# Patient Record
Sex: Female | Born: 1985 | Race: White | Marital: Married | Smoking: Never smoker
Health system: Southern US, Community
[De-identification: ages and names within clinical notes are randomized; demographics above are authoritative.]

---

## 2020-07-25 ENCOUNTER — Other Ambulatory Visit: Payer: Self-pay | Admitting: Obstetrics and Gynecology

## 2020-07-25 DIAGNOSIS — Z3149 Encounter for other procreative investigation and testing: Secondary | ICD-10-CM

## 2020-09-20 NOTE — L&D Delivery Note (Addendum)
Delivery Summary for Hermina Staggers  Labor Events:   Preterm labor: No data found  Rupture date: 08/01/2021  Rupture time: 8:25 PM  Rupture type: Artificial  Fluid Color: Clear Light Meconium  Induction: No data found  Augmentation: No data found  Complications: No data found  Cervical ripening: No data found No data found   No data found     Delivery:   Episiotomy: No data found  Lacerations: No data found  Repair suture: No data found  Repair # of packets: No data found  Blood loss (ml): 850   Information for the patient's newborn:  Deziyah, Arvin [[id number]]   Delivery 08/02/2021 5:15 AM by  C-Section, Low Transverse Sex:  female Gestational Age: [redacted]w[redacted]d Delivery Clinician:   Living?:         APGARS  One minute Five minutes Ten minutes  Skin color:        Heart rate:        Grimace:        Muscle tone:        Breathing:        Totals: 8  8      Presentation/position:      Resuscitation:   Cord information:    Disposition of cord blood:     Blood gases sent?  Complications:   Placenta: Delivered:       appearance Newborn Measurements: Weight: 7 lb 8.6 oz (3420 g)  Height: 19.76"  Head circumference:    Chest circumference:    Other providers:    Additional  information: Forceps:   Vacuum:   Breech:   Observed anomalies       See Dr. Oretha Milch Operative note for details of C-section procedure.    Hildred Laser, MD [facility]

## 2020-09-25 ENCOUNTER — Ambulatory Visit
Admission: RE | Admit: 2020-09-25 | Discharge: 2020-09-25 | Disposition: A | Discharge status: Home / Self Care | Payer: BC Managed Care – PPO | Source: Ambulatory Visit | Attending: Obstetrics and Gynecology | Admitting: Obstetrics and Gynecology

## 2020-09-25 ENCOUNTER — Other Ambulatory Visit: Payer: Self-pay

## 2020-09-25 DIAGNOSIS — Z3149 Encounter for other procreative investigation and testing: Secondary | ICD-10-CM | POA: Insufficient documentation

## 2020-09-25 DIAGNOSIS — N979 Female infertility, unspecified: Secondary | ICD-10-CM | POA: Insufficient documentation

## 2020-09-25 HISTORY — PX: HYSTEROSALPINGOGRAM: SHX6581

## 2020-09-25 MED ORDER — IOHEXOL 300 MG/ML  SOLN: 10.0000 mL | Freq: Once | INTRAMUSCULAR | Status: DC

## 2020-09-25 NOTE — Procedures (Signed)
[id number]  1986/01/29 35 y.o. @TODAY @  , MD  Hysterosalpingogram Procedure Note  Date of procedure: 09/25/2020   Pre-operative Diagnosis: Infertility  Post-operative Diagnosis: same, bilaterally patent tubes  Procedure: Hysterosalpingogram  Surgeon: 11/23/2020, MD  Assistant(s):  Radiology assistant. The radiologist present for today read the imaging and agreed with findings below.  Anesthesia: None  Estimated Blood Loss:  None         Complications:  None; patient tolerated the procedure well.         Disposition: To home         Condition: stable  Findings: Bilateral fill and spill of the tubes and a normal endometrial contour was noted.  Procedure Details  HSG procedure discussed with the patient.  Risks, complications, alternatives have been reviewed with her and she agrees to proceed.   The patient presented to the radiology lab and was identified as the correct patient and the procedure verified as an HSG. A verbal Time Out was held with all team members present and in agreement.  Speculum was inserted in to the vagina and the cervix visualized.  The cervix was cleaned with betadine solution. The HSG catheter was inserted and the balloon insufflated with approximately 1.5 ml of air.  Patient was then repositioned for fluoroscopy.  A total of 15 ml of contrast was used for the procedure. The patient tolerated the procedure well, no complications.   Bilateral fill and spill of the tubes and a normal endometrial contour was noted.  Results were reviewed with the patient at the time of the procedure. She verbalized understanding.   Cline Cools, MD 09/25/2020

## 2021-01-19 DIAGNOSIS — Z3493 Encounter for supervision of normal pregnancy, unspecified, third trimester: Secondary | ICD-10-CM | POA: Insufficient documentation

## 2021-01-22 LAB — OB RESULTS CONSOLE RUBELLA ANTIBODY, IGM: Rubella: IMMUNE

## 2021-01-22 LAB — OB RESULTS CONSOLE HEPATITIS B SURFACE ANTIGEN: Hepatitis B Surface Ag: NEGATIVE

## 2021-01-22 LAB — OB RESULTS CONSOLE VARICELLA ZOSTER ANTIBODY, IGG: Varicella: IMMUNE

## 2021-06-02 ENCOUNTER — Encounter
Admission: RE | Admit: 2021-06-02 | Discharge: 2021-06-02 | Disposition: A | Discharge status: Home / Self Care | Payer: BC Managed Care – PPO | Source: Ambulatory Visit | Attending: Anesthesiology | Admitting: Anesthesiology

## 2021-06-02 ENCOUNTER — Other Ambulatory Visit: Payer: Self-pay

## 2021-06-02 NOTE — Consult Note (Signed)
[facility] Anesthesia Consultation  SIDDHI DORNBUSH OLI:[id number] DOB: 24-Jul-1986 DOA: 06/02/2021 [facility]   Requesting physician: Heloise Ochoa, CNM Date of consultation: 06/02/21 Reason for consultation: Hx of lumbar disc degeneration  CHIEF COMPLAINT:  Pregnancy  HISTORY OF PRESENT ILLNESS: Denise Mcgee  is a 35 y.o. female with a known history of lumbar disc degeneration. She has had back problems for several years and has been through PT several times, including during this pregnancy. Denies personal or family hx of bleeding disorders.   PAST MEDICAL HISTORY:  No past medical history on file.  PAST SURGICAL HISTORY: Wisdom teeth extraction  SOCIAL HISTORY:  Social History   Tobacco Use   Smoking status: Not on file   Smokeless tobacco: Not on file  Substance Use Topics   Alcohol use: Not on file    FAMILY HISTORY: No family history on file.  DRUG ALLERGIES: Not on File  REVIEW OF SYSTEMS:   RESPIRATORY: No cough, shortness of breath, wheezing.  CARDIOVASCULAR: No chest pain, orthopnea, edema.  HEMATOLOGY: No anemia, easy bruising or bleeding SKIN: No rash or lesion. NEUROLOGIC: No tingling, numbness, weakness.  PSYCHIATRY: No anxiety or depression.   MEDICATIONS AT HOME:  Prior to Admission medications   Not on File      PHYSICAL EXAMINATION:   VITAL SIGNS: There were no vitals taken for this visit.  GENERAL:  35 y.o.-year-old patient no acute distress.  HEENT: Head atraumatic, normocephalic.  LUNGS: Normal breath sounds bilaterally, no wheezing, rales,rhonchi. No use of accessory muscles of respiration.  CARDIOVASCULAR: S1, S2 normal. No murmurs, rubs, or gallops.  EXTREMITIES: No pedal edema, cyanosis, or clubbing.  NEUROLOGIC: normal gait PSYCHIATRIC: The patient is alert and oriented x 3.  SKIN: No obvious rash, lesion, or ulcer.    IMPRESSION AND PLAN:   Aurora Rody  is a 35 y.o.  female presenting at [redacted] weeks gestation with hx of chronic back pain. No hx of back surgery.   Discussed epidural as an option for labor analgesia. Pt concerned given her hx of chronic back pain. Discussed that epidural is safe even in this setting and that there has not been shown to be a link between epidurals and worsening of chronic back pain. Spinal interspaces palpable and would not expect difficult placement.

## 2021-06-30 LAB — OB RESULTS CONSOLE RPR: RPR: NONREACTIVE

## 2021-06-30 LAB — OB RESULTS CONSOLE GC/CHLAMYDIA
Chlamydia: NEGATIVE
Gonorrhea: NEGATIVE

## 2021-06-30 LAB — OB RESULTS CONSOLE HIV ANTIBODY (ROUTINE TESTING): HIV: NONREACTIVE

## 2021-06-30 LAB — OB RESULTS CONSOLE GBS: GBS: NEGATIVE

## 2021-07-27 ENCOUNTER — Encounter: Payer: Self-pay | Admitting: Obstetrics

## 2021-07-27 ENCOUNTER — Other Ambulatory Visit: Payer: Self-pay | Admitting: Obstetrics

## 2021-07-27 DIAGNOSIS — O480 Post-term pregnancy: Secondary | ICD-10-CM

## 2021-07-27 NOTE — Progress Notes (Signed)
G1P0000. at [redacted]w[redacted]d, LMP of 10/17/20, c/w early [place] at [redacted]w[redacted]d.  Scheduled for induction of labor for postdates on 08/01/21 @ 0500.   Prenatal provider: [facility] Pregnancy complicated by: Elevated 1 hr GTT Weak rh pos Bilateral low back pain without sciatica  Prenatal Labs: Blood type/Rh O weak D  Antibody screen neg  Rubella Immune  Varicella Immune  RPR NR  HBsAg Neg  HIV NR  GC neg  Chlamydia neg  Genetic screening cfDNA negative  1 hour GTT 141  3 hour GTT 86, 119, 120,98  GBS Neg   Tdap: 05/12/21 Flu: 05/29/22 Contraception: TBD Feeding preference: TBD  ____ Chari Manning, CNM Certified Nurse Midwife [facility]

## 2021-07-30 ENCOUNTER — Other Ambulatory Visit
Admission: RE | Admit: 2021-07-30 | Discharge: 2021-07-30 | Disposition: A | Discharge status: Home / Self Care | Payer: BC Managed Care – PPO | Source: Ambulatory Visit | Attending: Obstetrics and Gynecology | Admitting: Obstetrics and Gynecology

## 2021-07-30 ENCOUNTER — Other Ambulatory Visit: Payer: Self-pay

## 2021-07-30 DIAGNOSIS — Z20822 Contact with and (suspected) exposure to covid-19: Secondary | ICD-10-CM | POA: Insufficient documentation

## 2021-07-30 LAB — SARS CORONAVIRUS 2 (TAT 6-24 HRS): SARS Coronavirus 2: NEGATIVE

## 2021-08-01 ENCOUNTER — Inpatient Hospital Stay: Payer: BC Managed Care – PPO | Admitting: Anesthesiology

## 2021-08-01 ENCOUNTER — Other Ambulatory Visit: Payer: Self-pay

## 2021-08-01 ENCOUNTER — Inpatient Hospital Stay
Admission: EM | Admit: 2021-08-01 | Discharge: 2021-08-05 | DRG: 787 | Disposition: A | Discharge status: Home / Self Care | Payer: BC Managed Care – PPO | Attending: Obstetrics and Gynecology | Admitting: Obstetrics and Gynecology

## 2021-08-01 ENCOUNTER — Encounter: Payer: Self-pay | Admitting: Emergency Medicine

## 2021-08-01 DIAGNOSIS — O480 Post-term pregnancy: Secondary | ICD-10-CM | POA: Diagnosis present

## 2021-08-01 DIAGNOSIS — D62 Acute posthemorrhagic anemia: Secondary | ICD-10-CM | POA: Diagnosis not present

## 2021-08-01 DIAGNOSIS — O9081 Anemia of the puerperium: Secondary | ICD-10-CM | POA: Diagnosis not present

## 2021-08-01 DIAGNOSIS — Z20822 Contact with and (suspected) exposure to covid-19: Secondary | ICD-10-CM | POA: Diagnosis present

## 2021-08-01 DIAGNOSIS — Z3A41 41 weeks gestation of pregnancy: Secondary | ICD-10-CM | POA: Diagnosis not present

## 2021-08-01 DIAGNOSIS — Z3490 Encounter for supervision of normal pregnancy, unspecified, unspecified trimester: Secondary | ICD-10-CM | POA: Diagnosis present

## 2021-08-01 LAB — GLUCOSE, CAPILLARY: Glucose-Capillary: 93 mg/dL (ref 70–99)

## 2021-08-01 LAB — ABO/RH
ABO/RH(D): O NEG
DAT, IgG: NEGATIVE
Weak D: POSITIVE

## 2021-08-01 LAB — CBC
HCT: 36.4 % (ref 36.0–46.0)
Hemoglobin: 12.8 g/dL (ref 12.0–15.0)
MCH: 30.9 pg (ref 26.0–34.0)
MCHC: 35.2 g/dL (ref 30.0–36.0)
MCV: 87.9 fL (ref 80.0–100.0)
Platelets: 177 10*3/uL (ref 150–400)
RBC: 4.14 MIL/uL (ref 3.87–5.11)
RDW: 12.5 % (ref 11.5–15.5)
WBC: 8.9 10*3/uL (ref 4.0–10.5)
nRBC: 0 % (ref 0.0–0.2)

## 2021-08-01 MED ORDER — FENTANYL-BUPIVACAINE-NACL 0.5-0.125-0.9 MG/250ML-% EP SOLN
EPIDURAL | Status: AC
  Filled 2021-08-01: qty 250

## 2021-08-01 MED ORDER — PHENYLEPHRINE 40 MCG/ML (10ML) SYRINGE FOR IV PUSH (FOR BLOOD PRESSURE SUPPORT): 80.0000 ug | PREFILLED_SYRINGE | INTRAVENOUS | Status: DC | PRN

## 2021-08-01 MED ORDER — LIDOCAINE-EPINEPHRINE (PF) 1.5 %-1:200000 IJ SOLN
INTRAMUSCULAR | Status: DC | PRN
  Administered 2021-08-01: 3 mL via PERINEURAL

## 2021-08-01 MED ORDER — LIDOCAINE HCL (PF) 1 % IJ SOLN
INTRAMUSCULAR | Status: AC
  Filled 2021-08-01: qty 30

## 2021-08-01 MED ORDER — LACTATED RINGERS IV SOLN: 500.0000 mL | Freq: Once | INTRAVENOUS | Status: DC

## 2021-08-01 MED ORDER — DIPHENHYDRAMINE HCL 50 MG/ML IJ SOLN: 12.5000 mg | INTRAMUSCULAR | Status: DC | PRN

## 2021-08-01 MED ORDER — FENTANYL-BUPIVACAINE-NACL 0.5-0.125-0.9 MG/250ML-% EP SOLN: 12.0000 mL/h | EPIDURAL | Status: DC | PRN

## 2021-08-01 MED ORDER — MISOPROSTOL 200 MCG PO TABS
ORAL_TABLET | ORAL | Status: AC
  Filled 2021-08-01: qty 4

## 2021-08-01 MED ORDER — OXYTOCIN 10 UNIT/ML IJ SOLN
INTRAMUSCULAR | Status: AC
  Filled 2021-08-01: qty 2

## 2021-08-01 MED ORDER — MISOPROSTOL 200 MCG PO TABS
ORAL_TABLET | ORAL | Status: AC
  Administered 2021-08-01: 25 ug via VAGINAL
  Filled 2021-08-01: qty 4

## 2021-08-01 MED ORDER — OXYTOCIN-SODIUM CHLORIDE 30-0.9 UT/500ML-% IV SOLN
2.5000 [IU]/h | INTRAVENOUS | Status: DC
  Administered 2021-08-02: 2.5 [IU]/h via INTRAVENOUS
  Filled 2021-08-01: qty 500

## 2021-08-01 MED ORDER — ONDANSETRON HCL 4 MG/2ML IJ SOLN: 4.0000 mg | Freq: Four times a day (QID) | INTRAMUSCULAR | Status: DC | PRN

## 2021-08-01 MED ORDER — OXYTOCIN-SODIUM CHLORIDE 30-0.9 UT/500ML-% IV SOLN
1.0000 m[IU]/min | INTRAVENOUS | Status: DC
  Administered 2021-08-01 – 2021-08-02 (×2): 2 m[IU]/min via INTRAVENOUS
  Filled 2021-08-01: qty 500

## 2021-08-01 MED ORDER — LIDOCAINE HCL (PF) 1 % IJ SOLN: 30.0000 mL | INTRAMUSCULAR | Status: DC | PRN

## 2021-08-01 MED ORDER — TERBUTALINE SULFATE 1 MG/ML IJ SOLN: 0.2500 mg | Freq: Once | INTRAMUSCULAR | Status: DC | PRN

## 2021-08-01 MED ORDER — BUPIVACAINE HCL (PF) 0.25 % IJ SOLN
INTRAMUSCULAR | Status: DC | PRN
  Administered 2021-08-01: 6 mL via EPIDURAL
  Administered 2021-08-01: 5 mL via EPIDURAL

## 2021-08-01 MED ORDER — LACTATED RINGERS IV SOLN: INTRAVENOUS | Status: DC

## 2021-08-01 MED ORDER — EPHEDRINE 5 MG/ML INJ: 10.0000 mg | INTRAVENOUS | Status: DC | PRN

## 2021-08-01 MED ORDER — MISOPROSTOL 25 MCG QUARTER TABLET
25.0000 ug | ORAL_TABLET | ORAL | Status: DC | PRN
  Filled 2021-08-01 (×2): qty 1

## 2021-08-01 MED ORDER — BUTORPHANOL TARTRATE 1 MG/ML IJ SOLN: 1.0000 mg | INTRAMUSCULAR | Status: DC | PRN

## 2021-08-01 MED ORDER — ACETAMINOPHEN 325 MG PO TABS: 650.0000 mg | ORAL_TABLET | ORAL | Status: DC | PRN

## 2021-08-01 MED ORDER — AMMONIA AROMATIC IN INHA
RESPIRATORY_TRACT | Status: AC
  Filled 2021-08-01: qty 10

## 2021-08-01 MED ORDER — SOD CITRATE-CITRIC ACID 500-334 MG/5ML PO SOLN
30.0000 mL | ORAL | Status: DC | PRN
  Administered 2021-08-02: 30 mL via ORAL

## 2021-08-01 MED ORDER — LIDOCAINE HCL (PF) 1 % IJ SOLN
INTRAMUSCULAR | Status: DC | PRN
  Administered 2021-08-01: 3 mL

## 2021-08-01 MED ORDER — OXYTOCIN BOLUS FROM INFUSION: 333.0000 mL | Freq: Once | INTRAVENOUS | Status: DC

## 2021-08-01 MED ORDER — LACTATED RINGERS IV SOLN
500.0000 mL | INTRAVENOUS | Status: DC | PRN
  Administered 2021-08-01 (×3): 500 mL via INTRAVENOUS

## 2021-08-01 NOTE — Progress Notes (Signed)
Labor Progress Note  Denise Mcgee is a 35 y.o. G1P0000 at [redacted]w[redacted]d by LMP admitted for induction of labor due to postdates pregnancy.  Subjective: feeling more comfortable with epidural   Objective: BP 130/79 (BP Location: Left Arm)   Pulse 63   Temp 98 F (36.7 C) (Oral)   Resp 12   Ht 5\' 3"  (1.6 m)   Wt 69.4 kg   LMP 09/18/2020 (Exact Date)   SpO2 100%   BMI 27.10 kg/m  Notable VS details:   Fetal Assessment: FHT:  FHR: 150 bpm, variability: moderate,  accelerations:  Present,  decelerations:  Present earlies Category/reactivity:  Category I UC:   regular, every 2-4 minutes SVE:   3-4/70/-2/soft/mid Membrane status: AROM at 2025 Amniotic color: clear  Labs: Lab Results  Component Value Date   WBC 8.9 08/01/2021   HGB 12.8 08/01/2021   HCT 36.4 08/01/2021   MCV 87.9 08/01/2021   PLT 177 08/01/2021    Assessment / Plan: Induction of labor due to postterm,  progressing well on pitocin  Labor: Progressing normally Fetal Wellbeing:  Category I Pain Control:  Epidural I/D:  n/a Anticipated MOD:  NSVD  13/08/2021, CNM 08/01/2021, 8:47 PM

## 2021-08-01 NOTE — Anesthesia Preprocedure Evaluation (Addendum)
Anesthesia Evaluation  Patient identified by MRN, date of birth, ID band Patient awake    Reviewed: Allergy & Precautions, NPO status , Patient's Chart, lab work & pertinent test results  Airway Mallampati: II  TM Distance: >3 FB Neck ROM: Full    Dental  (+) Teeth Intact   Pulmonary neg pulmonary ROS,           Cardiovascular negative cardio ROS       Neuro/Psych negative neurological ROS  negative psych ROS   GI/Hepatic Neg liver ROS,   Endo/Other  negative endocrine ROS  Renal/GU negative Renal ROS     Musculoskeletal negative musculoskeletal ROS (+)   Abdominal   Peds  Hematology negative hematology ROS (+)   Anesthesia Other Findings   Reproductive/Obstetrics (+) Pregnancy                             Anesthesia Physical Anesthesia Plan  ASA: 2 and emergent  Anesthesia Plan: Epidural   Post-op Pain Management:    Induction:   PONV Risk Score and Plan:   Airway Management Planned:   Additional Equipment:   Intra-op Plan:   Post-operative Plan:   Informed Consent: I have reviewed the patients History and Physical, chart, labs and discussed the procedure including the risks, benefits and alternatives for the proposed anesthesia with the patient or authorized representative who has indicated his/her understanding and acceptance.       Plan Discussed with:   Anesthesia Plan Comments: (OSA score 0 Epidural working well. Can use for C/S. Discussed with patient and spouse. )       Anesthesia Quick Evaluation

## 2021-08-01 NOTE — H&P (Signed)
OB History & Physical   History of Present Illness:   Chief Complaint: scheduled IOL for postdates pregnancy   HPI:  Denise Mcgee is a 35 y.o. G2P0000 female at [redacted]w[redacted]d dated by LMP of 10/17/2020, c/w [place] at [redacted]w[redacted]d, with Estimated Date of Delivery: 07/24/21.  She presents to L&D for scheduled IOL for postdates pregnancy.   Reports active fetal movement  Contractions: irregular cramping since misoprostol  LOF/SROM: denies  Vaginal bleeding: denies   Factors complicating pregnancy:  Elevated 1 hr GTT Weak rh pos Bilateral low back pain without sciatica  Patient Active Problem List   Diagnosis Date Noted   Encounter for planned induction of labor 08/01/2021   Normal pregnancy, third trimester 01/19/2021     Maternal Medical History:  History reviewed. No pertinent past medical history.  Past Surgical History:  Procedure Laterality Date   HYSTEROSALPINGOGRAM  09/25/2020        No Known Allergies  Prior to Admission medications   Medication Sig Start Date End Date Taking? Authorizing Provider  Prenatal Vit-Fe Fumarate-FA (PRENATAL MULTIVITAMIN) TABS tablet Take 1 tablet by mouth daily at 12 noon.   Yes [provider]     Prenatal care site:  [facility]  Social History: She  reports that she has never smoked. She has never been exposed to tobacco smoke. She has never used smokeless tobacco. She reports that she does not drink alcohol.  Family History: family history is not on file.   Review of Systems: A full review of systems was performed and negative except as noted in the HPI.     Physical Exam:  Vital Signs: BP 126/79 (BP Location: Left Arm)   Pulse 75   Temp 98.1 F (36.7 C) (Oral)   Resp 17   Ht 5\' 3"  (1.6 m)   Wt 69.4 kg   LMP 09/18/2020 (Exact Date)   BMI 27.10 kg/m    General: no acute distress.  HEENT: normocephalic, atraumatic Heart: regular rate & rhythm.  No murmurs/rubs/gallops Lungs: clear to auscultation bilaterally,  normal respiratory effort Abdomen: soft, gravid, non-tender;  EFW: 8lbs  Pelvic:   External: Normal external female genitalia  Cervix: Dilation: 1.5 / Effacement (%): 70 / Station: -2    Extremities: non-tender, symmetric, No edema bilaterally.  DTRs: 2+/2+  Neurologic: Alert & oriented x 3.    Results for orders placed or performed during the hospital encounter of 08/01/21 (from the past 24 hour(s))  CBC     Status: None   Collection Time: 08/01/21  5:28 AM  Result Value Ref Range   WBC 8.9 4.0 - 10.5 K/uL   RBC 4.14 3.87 - 5.11 MIL/uL   Hemoglobin 12.8 12.0 - 15.0 g/dL   HCT 13/12/22 71.6 - 96.7 %   MCV 87.9 80.0 - 100.0 fL   MCH 30.9 26.0 - 34.0 pg   MCHC 35.2 30.0 - 36.0 g/dL   RDW 89.3 81.0 - 17.5 %   Platelets 177 150 - 400 K/uL   nRBC 0.0 0.0 - 0.2 %  Type and screen     Status: None (Preliminary result)   Collection Time: 08/01/21  5:28 AM  Result Value Ref Range   ABO/RH(D) O NEG    Antibody Screen POS    Sample Expiration 08/04/2021,2359    Antibody Identification      PASSIVELY ACQUIRED ANTI-D NON SPECIFIC ANTIBODY REACTIVITY   DAT, IgG NEG    Weak D      POS Performed at  [facility], [address], [place], [place] [address]    Unit Number [id number]    Blood Component Type RED CELLS,LR    Unit division 00    Status of Unit ALLOCATED    Transfusion Status OK TO TRANSFUSE    Crossmatch Result COMPATIBLE    Unit Number [id number]    Blood Component Type RED CELLS,LR    Unit division 00    Status of Unit ALLOCATED    Transfusion Status OK TO TRANSFUSE    Crossmatch Result COMPATIBLE   ABO/Rh     Status: None   Collection Time: 08/01/21  7:10 AM  Result Value Ref Range   ABO/RH(D) O NEG    DAT, IgG NEG    Weak D      POS Performed at [facility], [address], [place], [place] [address]     Pertinent Results:  Prenatal Labs: Blood type/Rh O weak D  Antibody screen neg  Rubella Immune  Varicella Immune  RPR NR  HBsAg  Neg  HIV NR  GC neg  Chlamydia neg  Genetic screening cfDNA negative  1 hour GTT 141  3 hour GTT 86, 119, 120,98  GBS Neg   FHT:  FHR: 135 bpm, variability: moderate,  accelerations:  Present,  decelerations:  Absent Category/reactivity:  Category I UC:   regular, every 2-3 minutes since misoprostol   Cephalic by Leopolds and SVE   No results found.  Assessment:  Denise Mcgee is a 35 y.o. G2P0000 female at [redacted]w[redacted]d with postdates pregnancy.   Plan:  1. Admit to Labor & Delivery; consents reviewed and obtained - Covid admission screen   2. Fetal Well being  - Fetal Tracing: cat 1 - Group B Streptococcus ppx not indicated: GBS neg - Presentation: cephalic confirmed by SVE   3. Routine OB: - Prenatal labs reviewed, as above - Rh Pos - CBC, T&S, RPR on admit - Reg diet, IVF  4. Induction of labor  - Contractions monitored with external toco - Pelvis adequate for trial of labor  - Plan for induction with misoprostol  - Induction with oxytocin, AROM, and cervical balloon as appropriate  - Plan for  continuous fetal monitoring - Maternal pain control as desired; planning regional anesthesia - Anticipate vaginal delivery  5. Post Partum Planning: - Infant feeding: TBD - Contraception: TBD - Tdap vaccine: given 05/12/2021 - Flu vaccine: given 05/29/2021  Gustavo Lah, CNM 08/01/21 12:01 PM  Margaretmary Eddy, CNM Certified Nurse Midwife [facility]

## 2021-08-01 NOTE — Anesthesia Procedure Notes (Signed)
Epidural Patient location during procedure: OB Start time: 08/01/2021 6:36 PM  Staffing Anesthesiologist: Johny Drilling, MD Performed: anesthesiologist   Preanesthetic Checklist Completed: patient identified, IV checked, site marked, risks and benefits discussed, surgical consent, monitors and equipment checked, pre-op evaluation and timeout performed  Epidural Patient position: sitting Prep: ChloraPrep Patient monitoring: heart rate, continuous pulse ox and blood pressure Approach: midline Location: L3-L4 Injection technique: LOR saline  Needle:  Needle type: Tuohy  Needle gauge: 18 G Needle length: 9 cm Needle insertion depth: 5 cm Catheter type: closed end flexible Catheter size: 20 Guage Catheter at skin depth: 9 cm Test dose: negative and 1.5% lidocaine with Epi 1:200 K  Additional Notes Tolerated well. No complications noted. Reason for block:at surgeon's request and procedure for pain

## 2021-08-02 ENCOUNTER — Encounter: Admission: EM | Disposition: A | Discharge status: Home / Self Care | Payer: Self-pay | Source: Home / Self Care

## 2021-08-02 ENCOUNTER — Encounter: Payer: Self-pay | Admitting: Obstetrics and Gynecology

## 2021-08-02 DIAGNOSIS — Z3A41 41 weeks gestation of pregnancy: Secondary | ICD-10-CM

## 2021-08-02 DIAGNOSIS — O480 Post-term pregnancy: Principal | ICD-10-CM

## 2021-08-02 LAB — RPR: RPR Ser Ql: NONREACTIVE

## 2021-08-02 SURGERY — Surgical Case
Anesthesia: Epidural

## 2021-08-02 MED ORDER — MENTHOL 3 MG MT LOZG
1.0000 | LOZENGE | OROMUCOSAL | Status: DC | PRN
  Filled 2021-08-02: qty 9

## 2021-08-02 MED ORDER — SIMETHICONE 80 MG PO CHEW: 80.0000 mg | CHEWABLE_TABLET | ORAL | Status: DC | PRN

## 2021-08-02 MED ORDER — KETOROLAC TROMETHAMINE 30 MG/ML IJ SOLN
INTRAMUSCULAR | Status: AC
  Filled 2021-08-02: qty 1

## 2021-08-02 MED ORDER — SENNOSIDES-DOCUSATE SODIUM 8.6-50 MG PO TABS
2.0000 | ORAL_TABLET | Freq: Every day | ORAL | Status: DC
  Administered 2021-08-03: 2 via ORAL
  Filled 2021-08-02 (×3): qty 2

## 2021-08-02 MED ORDER — KETAMINE HCL 50 MG/ML IJ SOLN
INTRAMUSCULAR | Status: AC
  Filled 2021-08-02: qty 10

## 2021-08-02 MED ORDER — ACETAMINOPHEN 500 MG PO TABS
1000.0000 mg | ORAL_TABLET | Freq: Four times a day (QID) | ORAL | Status: DC
  Administered 2021-08-02 – 2021-08-05 (×10): 1000 mg via ORAL
  Filled 2021-08-02 (×12): qty 2

## 2021-08-02 MED ORDER — EPHEDRINE SULFATE 50 MG/ML IJ SOLN
INTRAMUSCULAR | Status: DC | PRN
  Administered 2021-08-02 (×2): 5 mg via INTRAVENOUS

## 2021-08-02 MED ORDER — DIPHENHYDRAMINE HCL 50 MG/ML IJ SOLN
INTRAMUSCULAR | Status: DC | PRN
  Administered 2021-08-02: 12.5 mg via INTRAVENOUS

## 2021-08-02 MED ORDER — DIPHENHYDRAMINE HCL 25 MG PO CAPS: 25.0000 mg | ORAL_CAPSULE | Freq: Four times a day (QID) | ORAL | Status: DC | PRN

## 2021-08-02 MED ORDER — BUPIVACAINE LIPOSOME 1.3 % IJ SUSP
20.0000 mL | Freq: Once | INTRAMUSCULAR | Status: DC
  Filled 2021-08-02: qty 20

## 2021-08-02 MED ORDER — LIDOCAINE HCL (PF) 2 % IJ SOLN
INTRAMUSCULAR | Status: AC
  Filled 2021-08-02: qty 5

## 2021-08-02 MED ORDER — LACTATED RINGERS IV SOLN: INTRAVENOUS | Status: DC

## 2021-08-02 MED ORDER — SODIUM CHLORIDE 0.9% FLUSH
50.0000 mL | Freq: Once | INTRAVENOUS | Status: DC
  Filled 2021-08-02 (×3): qty 51

## 2021-08-02 MED ORDER — PHENYLEPHRINE HCL (PRESSORS) 10 MG/ML IV SOLN
INTRAVENOUS | Status: DC | PRN
  Administered 2021-08-02: 50 ug via INTRAVENOUS

## 2021-08-02 MED ORDER — ZOLPIDEM TARTRATE 5 MG PO TABS: 5.0000 mg | ORAL_TABLET | Freq: Every evening | ORAL | Status: DC | PRN

## 2021-08-02 MED ORDER — MAGNESIUM HYDROXIDE 400 MG/5ML PO SUSP: 30.0000 mL | ORAL | Status: DC | PRN

## 2021-08-02 MED ORDER — BUPIVACAINE HCL 0.5 % IJ SOLN
INTRAMUSCULAR | Status: DC | PRN
  Administered 2021-08-02: 20 mL

## 2021-08-02 MED ORDER — ONDANSETRON HCL 4 MG/2ML IJ SOLN
INTRAMUSCULAR | Status: DC | PRN
  Administered 2021-08-02: 4 mg via INTRAVENOUS

## 2021-08-02 MED ORDER — COCONUT OIL OIL
1.0000 "application " | TOPICAL_OIL | Status: DC | PRN
  Filled 2021-08-02: qty 120

## 2021-08-02 MED ORDER — CEFAZOLIN SODIUM-DEXTROSE 2-4 GM/100ML-% IV SOLN
2.0000 g | INTRAVENOUS | Status: AC
  Administered 2021-08-02: 2 g via INTRAVENOUS
  Filled 2021-08-02: qty 100

## 2021-08-02 MED ORDER — KETAMINE HCL 50 MG/ML IJ SOLN
INTRAMUSCULAR | Status: DC | PRN
  Administered 2021-08-02: 25 mg via INTRAMUSCULAR

## 2021-08-02 MED ORDER — KETOROLAC TROMETHAMINE 30 MG/ML IJ SOLN
INTRAMUSCULAR | Status: DC | PRN
  Administered 2021-08-02: 30 mg via INTRAVENOUS

## 2021-08-02 MED ORDER — TRAMADOL HCL 50 MG PO TABS: 50.0000 mg | ORAL_TABLET | Freq: Four times a day (QID) | ORAL | Status: DC | PRN

## 2021-08-02 MED ORDER — PROPOFOL 10 MG/ML IV BOLUS
INTRAVENOUS | Status: DC | PRN
  Administered 2021-08-02: 10 mg via INTRAVENOUS

## 2021-08-02 MED ORDER — IBUPROFEN 600 MG PO TABS: 600.0000 mg | ORAL_TABLET | Freq: Four times a day (QID) | ORAL | Status: DC

## 2021-08-02 MED ORDER — SENSORCAINE 0.25 % 300ML FOR PAIN PUMP OPTIME
INTRAMUSCULAR | Status: DC | PRN
  Administered 2021-08-02: 30 mL

## 2021-08-02 MED ORDER — PRENATAL MULTIVITAMIN CH
1.0000 | ORAL_TABLET | Freq: Every day | ORAL | Status: DC
  Administered 2021-08-02 – 2021-08-05 (×4): 1 via ORAL
  Filled 2021-08-02 (×4): qty 1

## 2021-08-02 MED ORDER — DIPHENHYDRAMINE HCL 50 MG/ML IJ SOLN
INTRAMUSCULAR | Status: AC
  Filled 2021-08-02: qty 1

## 2021-08-02 MED ORDER — SOD CITRATE-CITRIC ACID 500-334 MG/5ML PO SOLN
ORAL | Status: AC
  Filled 2021-08-02: qty 15

## 2021-08-02 MED ORDER — PHENYLEPHRINE HCL (PRESSORS) 10 MG/ML IV SOLN
INTRAVENOUS | Status: AC
  Filled 2021-08-02: qty 1

## 2021-08-02 MED ORDER — MORPHINE SULFATE (PF) 0.5 MG/ML IJ SOLN
INTRAMUSCULAR | Status: DC | PRN
  Administered 2021-08-02: .5 mg via EPIDURAL
  Administered 2021-08-02: 1.5 mg via EPIDURAL

## 2021-08-02 MED ORDER — LIDOCAINE HCL (PF) 2 % IJ SOLN
INTRAMUSCULAR | Status: AC
  Filled 2021-08-02: qty 15

## 2021-08-02 MED ORDER — GABAPENTIN 300 MG PO CAPS
300.0000 mg | ORAL_CAPSULE | Freq: Two times a day (BID) | ORAL | Status: DC
  Administered 2021-08-02 – 2021-08-05 (×6): 300 mg via ORAL
  Filled 2021-08-02 (×6): qty 1

## 2021-08-02 MED ORDER — LIDOCAINE HCL (PF) 2 % IJ SOLN
INTRAMUSCULAR | Status: DC | PRN
  Administered 2021-08-02 (×5): 100 mg via INTRADERMAL

## 2021-08-02 MED ORDER — ONDANSETRON HCL 4 MG/2ML IJ SOLN: 4.0000 mg | Freq: Three times a day (TID) | INTRAMUSCULAR | Status: DC | PRN

## 2021-08-02 MED ORDER — OXYTOCIN-SODIUM CHLORIDE 30-0.9 UT/500ML-% IV SOLN: 2.5000 [IU]/h | INTRAVENOUS | Status: AC

## 2021-08-02 MED ORDER — PROPOFOL 10 MG/ML IV BOLUS
INTRAVENOUS | Status: AC
  Filled 2021-08-02: qty 20

## 2021-08-02 MED ORDER — WITCH HAZEL-GLYCERIN EX PADS: 1.0000 "application " | MEDICATED_PAD | CUTANEOUS | Status: DC | PRN

## 2021-08-02 MED ORDER — SODIUM CHLORIDE 0.9 % IV SOLN
INTRAVENOUS | Status: AC | PRN
  Administered 2021-08-02: 50 mL via INTRAMUSCULAR

## 2021-08-02 MED ORDER — DIBUCAINE (PERIANAL) 1 % EX OINT: 1.0000 "application " | TOPICAL_OINTMENT | CUTANEOUS | Status: DC | PRN

## 2021-08-02 MED ORDER — ENOXAPARIN SODIUM 40 MG/0.4ML IJ SOSY
40.0000 mg | PREFILLED_SYRINGE | INTRAMUSCULAR | Status: DC
  Administered 2021-08-03 – 2021-08-04 (×2): 40 mg via SUBCUTANEOUS
  Filled 2021-08-02 (×2): qty 0.4

## 2021-08-02 MED ORDER — KETOROLAC TROMETHAMINE 30 MG/ML IJ SOLN
30.0000 mg | Freq: Four times a day (QID) | INTRAMUSCULAR | Status: DC
  Administered 2021-08-02 – 2021-08-03 (×3): 30 mg via INTRAVENOUS
  Filled 2021-08-02 (×3): qty 1

## 2021-08-02 MED ORDER — KETOROLAC TROMETHAMINE 30 MG/ML IJ SOLN: 30.0000 mg | Freq: Once | INTRAMUSCULAR | Status: AC

## 2021-08-02 MED ORDER — MORPHINE SULFATE (PF) 0.5 MG/ML IJ SOLN
INTRAMUSCULAR | Status: AC
  Filled 2021-08-02: qty 10

## 2021-08-02 MED ORDER — TETANUS-DIPHTH-ACELL PERTUSSIS 5-2.5-18.5 LF-MCG/0.5 IM SUSY: 0.5000 mL | PREFILLED_SYRINGE | Freq: Once | INTRAMUSCULAR | Status: DC

## 2021-08-02 MED ORDER — SODIUM CHLORIDE 0.9 % IV SOLN
500.0000 mg | INTRAVENOUS | Status: AC
  Administered 2021-08-02: 500 mg via INTRAVENOUS
  Filled 2021-08-02: qty 500

## 2021-08-02 MED ORDER — ONDANSETRON HCL 4 MG/2ML IJ SOLN
INTRAMUSCULAR | Status: AC
  Filled 2021-08-02: qty 2

## 2021-08-02 MED ORDER — BUPIVACAINE HCL (PF) 0.5 % IJ SOLN
30.0000 mL | Freq: Once | INTRAMUSCULAR | Status: DC
  Filled 2021-08-02: qty 30

## 2021-08-02 MED ORDER — OXYCODONE-ACETAMINOPHEN 5-325 MG PO TABS: 2.0000 | ORAL_TABLET | ORAL | Status: DC | PRN

## 2021-08-02 MED ORDER — FERROUS SULFATE 325 (65 FE) MG PO TABS
325.0000 mg | ORAL_TABLET | ORAL | Status: DC
  Administered 2021-08-03 – 2021-08-05 (×2): 325 mg via ORAL
  Filled 2021-08-02 (×2): qty 1

## 2021-08-02 MED ORDER — HYDROMORPHONE HCL 1 MG/ML IJ SOLN: 1.0000 mg | INTRAMUSCULAR | Status: DC | PRN

## 2021-08-02 SURGICAL SUPPLY — 32 items
BAG COUNTER SPONGE SURGICOUNT (BAG) ×2 IMPLANT
BAG SURGICOUNT SPONGE COUNTING (BAG) ×1
CELL SAVER LIPIGURD (MISCELLANEOUS) ×1 IMPLANT
CHLORAPREP W/TINT 26 (MISCELLANEOUS) ×6 IMPLANT
DRSG PAD ABDOMINAL 8X10 ST (GAUZE/BANDAGES/DRESSINGS) ×3 IMPLANT
DRSG TELFA 3X8 NADH (GAUZE/BANDAGES/DRESSINGS) ×3 IMPLANT
ELECT REM PT RETURN 9FT ADLT (ELECTROSURGICAL) ×3
ELECTRODE REM PT RTRN 9FT ADLT (ELECTROSURGICAL) ×1 IMPLANT
EXTRT SYSTEM ALEXIS 14CM (MISCELLANEOUS) ×3
EXTRT SYSTEM ALEXIS 17CM (MISCELLANEOUS)
GAUZE SPONGE 4X4 12PLY STRL (GAUZE/BANDAGES/DRESSINGS) ×3 IMPLANT
GLOVE SURG ENC MOIS LTX SZ6.5 (GLOVE) ×3 IMPLANT
GLOVE SURG UNDER LTX SZ7 (GLOVE) ×3 IMPLANT
GOWN STRL REUS W/ TWL LRG LVL3 (GOWN DISPOSABLE) ×2 IMPLANT
GOWN STRL REUS W/TWL LRG LVL3 (GOWN DISPOSABLE) ×4
KIT TURNOVER KIT A (KITS) ×3 IMPLANT
MANIFOLD NEPTUNE II (INSTRUMENTS) ×3 IMPLANT
MAT PREVALON FULL STRYKER (MISCELLANEOUS) ×3 IMPLANT
NS IRRIG 1000ML POUR BTL (IV SOLUTION) ×3 IMPLANT
PACK C SECTION AR (MISCELLANEOUS) ×3 IMPLANT
PAD OB MATERNITY 4.3X12.25 (PERSONAL CARE ITEMS) ×3 IMPLANT
PAD PREP 24X41 OB/GYN DISP (PERSONAL CARE ITEMS) ×3 IMPLANT
PENCIL SMOKE EVACUATOR (MISCELLANEOUS) ×3 IMPLANT
SCRUB EXIDINE 4% CHG 4OZ (MISCELLANEOUS) ×3 IMPLANT
SUT MNCRL AB 4-0 PS2 18 (SUTURE) ×3 IMPLANT
SUT PLAIN 2 0 XLH (SUTURE) IMPLANT
SUT VIC AB 0 CT1 36 (SUTURE) ×12 IMPLANT
SUT VIC AB 3-0 SH 27 (SUTURE) ×2
SUT VIC AB 3-0 SH 27X BRD (SUTURE) ×1 IMPLANT
SYSTEM CONTND EXTRCTN KII BLLN (MISCELLANEOUS) IMPLANT
TAPE STRIPS DRAPE STRL (GAUZE/BANDAGES/DRESSINGS) ×3 IMPLANT
WATER STERILE IRR 500ML POUR (IV SOLUTION) ×3 IMPLANT

## 2021-08-02 NOTE — Progress Notes (Signed)
Labor Progress Note  Denise Mcgee is a 35 y.o. G1P0000 at [redacted]w[redacted]d by LMP admitted for induction of labor due to postdates pregnancy.  Subjective: comfortable with epidural  Objective: BP 115/72 (BP Location: Left Arm)   Pulse 61   Temp 99.2 F (37.3 C) (Axillary)   Resp 16   Ht 5\' 3"  (1.6 m)   Wt 69.4 kg   LMP 09/18/2020 (Exact Date)   SpO2 100%   BMI 27.10 kg/m  Notable VS details: reviewed   Fetal Assessment: FHT:  FHR: 160 bpm, variability: moderate,  accelerations:  Abscent,  decelerations:  Present late decelerations Category/reactivity:  Category II UC:   regular, every 2-4 minutes, MVU's adequate  SVE:   4-5/80/-2 Membrane status: AROM at 2025 Amniotic color: Meconium   Labs: Lab Results  Component Value Date   WBC 8.9 08/01/2021   HGB 12.8 08/01/2021   HCT 36.4 08/01/2021   MCV 87.9 08/01/2021   PLT 177 08/01/2021    Assessment / Plan: Protracted latent phase -s/p 1 dose of misoprostol -Oxytocin turned off d/t decels, restarted after period of reassurance.  -Minimal cervical change despite > 3 hours of adequate MVU's -Reviewed tracing with Dr. 13/08/2021  -discussed fetal tracing with Mackenzy and support person, risk/benefits of continued trial of labor versus proceeding with primary c/section for fetal intolerance of labor  -Starla consents for a primary c/section -OR team notified   Labor:  protracted latent phase  Fetal Wellbeing:  Category II - for recurrent late decelerations, oxytocin turned off  Pain Control:  Epidural I/D:   ROM x 7 hours, afebrile, GBS neg Anticipated MOD:   primary c/section   Lurena Joiner, CNM 08/02/2021, 3:46 AM

## 2021-08-02 NOTE — Discharge Instructions (Addendum)
Discharge instructions:   Call office if you have any of the following:  headache, visual changes, fever >101.0 F, chills, breast concerns (engorgement, mastitis) excessive vaginal bleeding, incision drainage or problems, leg pain or redness, depression or any other concerns.   Activity: Do not lift > 10 lbs for 6 weeks.  No intercourse or tampons for 6 weeks.  No driving for 1-2 weeks or while taking pain medication. No strenuous activity or heavy lifting for 6 weeks.  No swimming pools, hot tubs or tub baths- showers only.    It is normal to bleed for up to 6 weeks. You should not soak through more than 1 pad in 1 hour.   Continue prenatal vitamin. Increase calories and fluids while breastfeeding.  Your milk will come in, in the next couple of days (right now it is colostrum).  You may have a slight fever when your milk comes in, but it should go away on its own.   If it does not, and rises above 101 F please call the doctor.  You will also feel achy and your breasts will be firm. They will also start to leak.  If you are breastfeeding, continue as you have been and you can pump/express milk for comfort.   For concerns about your baby, please call your pediatrician For breastfeeding concerns, the lactation consultant can be reached at [phone]  Postpartum blues (feelings of happy one minute and sad another minute) are normal for the first few weeks but if it gets worse let your doctor know.   Cesarean Delivery, Care After Refer to this sheet in the next few weeks. These instructions provide you with information on caring for yourself after your procedure. Your health care provider may also give you specific instructions. Your treatment has been planned according to current medical practices, but problems sometimes occur. Call your health care provider if you have any problems or questions after you go home. HOME CARE INSTRUCTIONS  Please leave honey comb dressing (OP Site) on for 7  days.  You may shower during this period but turn your back to the water so that the dressing does not get directly saturated by the water.   You may take the dressing off on day 7.  The easiest way to do it is in the shower.  Allow the water to run over the dressing and it usually comes off easier.   Only take over-the-counter or prescription medications as directed by your health care provider. Do not drink alcohol, especially if you are breastfeeding or taking medication to relieve pain. Do not  smoke tobacco. Continue to use good perineal care. Good perineal care includes: Wiping your perineum from front to back. Keeping your perineum clean. Check your surgical cut (incision) daily for increased redness, drainage, swelling, or separation of skin. Shower and clean your incision gently with soap and water every day, by letting warm and soapy water run over the incision, and then pat it dry. If your health care provider says it is okay, leave the incision uncovered. Use a bandage (dressing) if the incision is draining fluid or appears irritated. If the adhesive strips across the incision do not fall off within 7 days, carefully peel them off, after a shower. Hug a pillow when coughing or sneezing until your incision is healed. This helps to relieve pain. Do not use tampons, douches or have sexual intercourse, until your health care provider says it is okay. Wear a well-fitting bra that provides breast support.  Limit wearing support panties or control-top hose. Drink enough fluids to keep your urine clear or pale yellow. Eat high-fiber foods such as whole grain cereals and breads, brown rice, beans, and fresh fruits and vegetables every day. These foods may help prevent or relieve constipation. Resume activities such as climbing stairs, driving, lifting, exercising, or traveling as directed by your health care provider. Try to have someone help you with your household activities and your newborn for  at least a few days after you leave the hospital. Rest as much as possible. Try to rest or take a nap when your newborn is sleeping. Increase your activities gradually. Do not lift more than 15lbs until directed by a provider. Keep all of your scheduled postpartum appointments. It is very important to keep your scheduled follow-up appointments. At these appointments, your health care provider will be checking to make sure that you are healing physically and emotionally. SEEK MEDICAL CARE IF:  You are passing large clots from your vagina. Save any clots to show your health care provider. You have a foul smelling discharge from your vagina. You have trouble urinating. You are urinating frequently. You have pain when you urinate. You have a change in your bowel movements. You have increasing redness, pain, or swelling near your incision. You have pus draining from your incision. Your incision is separating. You have painful, hard, or reddened breasts. You have a severe headache. You have blurred vision or see spots. You feel sad or depressed. You have thoughts of hurting yourself or your newborn. You have questions about your care, the care of your newborn, or medications. You are dizzy or light-headed. You have a rash. You have pain, redness, or swelling at the site of the removed intravenous access (IV) tube. You have nausea or vomiting. You stopped breastfeeding and have not had a menstrual period within 12 weeks of stopping. You are not breastfeeding and have not had a menstrual period within 12 weeks of delivery. You have a fever. SEEK IMMEDIATE MEDICAL CARE IF: You have persistent pain. You have chest pain. You have shortness of breath. You faint. You have leg pain. You have stomach pain. Your vaginal bleeding saturates 2 or more sanitary pads in 1 hour. MAKE SURE YOU:  Understand these instructions. Will watch your condition. Will get help right away if you are not doing  well or get worse. Document Released: 05/29/2002 Document Revised: 01/21/2014 Document Reviewed: 05/03/2012 [facility] Patient Information 2015 [place], [place]. This information is not intended to replace advice given to you by your health care provider. Make sure you discuss any questions you have with your health care provider.

## 2021-08-02 NOTE — Discharge Summary (Signed)
Obstetrical Discharge Summary  Patient Name: Denise Mcgee DOB: [birth year] MRN: [id number]  Date of Admission: 08/01/2021 Date of Delivery: 08/02/2021 Delivered by: Dr. Hildred Laser  First Assist: Margaretmary Eddy, CNM  Date of Discharge: 08/05/2021  Primary OB: [facility] [id number] last menstrual period was 09/18/2020 (exact date). EDC Estimated Date of Delivery: 07/24/21 Gestational Age at Delivery: [redacted]w[redacted]d   Antepartum complications:  Elevated 1 hr GTT Weak rh pos Bilateral low back pain without sciatica  Admitting Diagnosis: Encounter for planned induction of labor [Z34.90]  Secondary Diagnosis: nonreassuring FHR, primary LTCS Patient Active Problem List   Diagnosis Date Noted   Encounter for planned induction of labor 08/01/2021   Normal pregnancy, third trimester 01/19/2021    Induction: AROM, Pitocin, and Cytotec Complications: None Intrapartum complications/course: Denise Mcgee presented to L&D for a scheduled IOL for postdates pregnancy.  Persistent Cat II tracing with minimal cervical change noted during labor despite supportive measures.  Decision made to proceed with primary c/section for fetal intolerance of labor.  Please see OP note for further details.  Delivery Type: primary cesarean section, low transverse incision Anesthesia: epidural Placenta: manual  Laceration: none  Episiotomy: none Newborn Data: Live born baby boy  Birth Weight:  7#8 APGAR: 8, 8  Newborn Delivery   Birth date/time: 08/02/21 at 0515 Delivery type: LTCS     Postpartum Procedures: none  Edinburgh:  Edinburgh Postnatal Depression Scale Screening Tool 08/02/2021  I have been able to laugh and see the funny side of things. 0  I have looked forward with enjoyment to things. 0  I have blamed myself unnecessarily when things went wrong. 2  I have been anxious or worried for no good reason. 1  I have felt scared or panicky for no good reason. 1  Things have been getting  on top of me. 2  I have been so unhappy that I have had difficulty sleeping. 0  I have felt sad or miserable. 0  I have been so unhappy that I have been crying. 0  The thought of harming myself has occurred to me. 0  Edinburgh Postnatal Depression Scale Total 6     Post partum course:  Patient had an uncomplicated postpartum course.  By time of discharge on POD#3, her pain was controlled on oral pain medications; she had appropriate lochia and was ambulating, voiding without difficulty, tolerating regular diet and passing flatus.   She was deemed stable for discharge to home.    Discharge Physical Exam:  BP 123/85 (BP Location: Right Arm)   Pulse 80   Temp 98.2 F (36.8 C) (Oral)   Resp 18   Ht 5\' 3"  (1.6 m)   Wt 69.4 kg   LMP 09/18/2020 (Exact Date)   SpO2 99%   Breastfeeding Yes   BMI 27.10 kg/m   General: NAD CV: RRR Pulm: CTABL, nl effort ABD: s/nd/nt, fundus firm and below the umbilicus Lochia: moderate Incision: c/d/I, honeycomb dsg intact DVT Evaluation: LE non-ttp, no evidence of DVT on exam.  Hemoglobin  Date Value Ref Range Status  08/03/2021 9.3 (L) 12.0 - 15.0 g/dL Final    Comment:    REPEATED TO VERIFY   HCT  Date Value Ref Range Status  08/03/2021 27.9 (L) 36.0 - 46.0 % Final     Disposition: stable, discharge to home. Baby Feeding: breastmilk and formula Baby Disposition: home with mom  Rh Immune globulin given: Weak D positive  Rubella vaccine given: Immune Varivax vaccine given: Immune  Flu  vaccine given in AP or PP setting: given 05/29/2021 Tdap vaccine given in AP or PP setting: 05/12/2021  Contraception: TBD  Prenatal Labs:  Blood type/Rh O weak D  Antibody screen neg  Rubella Immune  Varicella Immune  RPR NR  HBsAg Neg  HIV NR  GC neg  Chlamydia neg  Genetic screening cfDNA negative  1 hour GTT 141  3 hour GTT 86, 119, 120,98  GBS Neg    Plan:  Denise Mcgee was discharged to home in good condition. Follow-up  appointment with Dr. Dalbert Garnet in 2 weeks.   Discharge Medications: Allergies as of 08/05/2021   No Known Allergies      Medication List     TAKE these medications    acetaminophen 500 MG tablet Commonly known as: TYLENOL Take 2 tablets (1,000 mg total) by mouth every 6 (six) hours.   coconut oil Oil Apply 1 application topically as needed.   ferrous sulfate 325 (65 FE) MG tablet Take 1 tablet (325 mg total) by mouth 2 (two) times daily with a meal.   gabapentin 300 MG capsule Commonly known as: NEURONTIN Take 1 capsule (300 mg total) by mouth 2 (two) times daily for 3 days.   ibuprofen 600 MG tablet Commonly known as: ADVIL Take 1 tablet (600 mg total) by mouth every 6 (six) hours.   prenatal multivitamin Tabs tablet Take 1 tablet by mouth daily at 12 noon.   senna-docusate 8.6-50 MG tablet Commonly known as: Senokot-S Take 2 tablets by mouth daily.   simethicone 80 MG chewable tablet Commonly known as: MYLICON Chew 1 tablet (80 mg total) by mouth as needed for flatulence.   traMADol 50 MG tablet Commonly known as: ULTRAM Take 1 tablet (50 mg total) by mouth every 6 (six) hours as needed for up to 5 days (mild pain).         Follow-up Information     Christeen Douglas, MD. Schedule an appointment as soon as possible for a visit on 08/17/2021.   Specialty: Obstetrics and Gynecology Why: post-op incision check @ 1:30 pm Contact information: [address] [place] [address] [phone]         [facility]. Schedule an appointment as soon as possible for a visit in 6 week(s).   Why: postpartum visit. Can be with Dr. Dalbert Garnet or Margaretmary Eddy, CNM (per Madilyne's preference). Contact information: [address] [place] [address] [phone]                Signed: MAYE PARKINSON, CNM 08/05/2021 11:02 AM

## 2021-08-02 NOTE — Progress Notes (Signed)
OB Attending Progress Note:   Called to assess patient at request of Margaretmary Eddy, CNM due to persistent Category II tracing (repetitive late decelerations).  Review of chart notes patient has been adequate with Pitocin augmentation for 2-3 hours, however no major cervical change noted (cervical exam 4-5/80/-2, performed by midwife) and persistent Category II tracing.  Decision made at this time to proceed with Cesarean Delivery.  The risks of surgery were discussed with the patient including but were not limited to: bleeding which may require transfusion or reoperation; infection which may require antibiotics; injury to bowel, bladder, ureters or other surrounding organs; injury to the fetus; need for additional procedures including hysterectomy in the event of a life-threatening hemorrhage; formation of adhesions; placental abnormalities with subsequent pregnancies; incisional problems; thromboembolic phenomenon and other postoperative/anesthesia complications.  The patient concurred with the proposed plan, giving informed written consent for the procedure.   Patient has been on clear liquid diet, she will remain NPO for procedure. Anesthesia and OR aware. Preoperative prophylactic antibiotics and SCDs ordered on call to the OR.  To OR when ready.   Hildred Laser, MD [facility]

## 2021-08-02 NOTE — Transfer of Care (Signed)
Immediate Anesthesia Transfer of Care Note  Patient: Denise Mcgee  Procedure(s) Performed: CESAREAN SECTION  Patient Location: Labor and delivery  Anesthesia Type:Epidural  Level of Consciousness: awake, alert  and oriented  Airway & Oxygen Therapy: Patient Spontanous Breathing  Post-op Assessment: Report given to RN and Post -op Vital signs reviewed and stable  Post vital signs: Reviewed and stable  Last Vitals:  Vitals Value Taken Time  BP 122/79 0617 all  Temp 50f   Pulse 75   Resp 11   SpO2 96     Last Pain:  Vitals:   08/02/21 0240  TempSrc: Axillary  PainSc:       Patients Stated Pain Goal: 0 (08/01/21 1923)  Complications: No notable events documented.

## 2021-08-02 NOTE — Progress Notes (Signed)
Labor Progress Note  Denise Mcgee is a 35 y.o. G1P0000 at 109w2d by LMP admitted for induction of labor due to postdates pregnancy.  Subjective: feeling comfortable with epidural  Objective: BP 115/72 (BP Location: Left Arm)   Pulse 61   Temp 99.2 F (37.3 C) (Axillary)   Resp 16   Ht 5\' 3"  (1.6 m)   Wt 69.4 kg   LMP 09/18/2020 (Exact Date)   SpO2 100%   BMI 27.10 kg/m  Notable VS details: reviewed   Fetal Assessment: FHT:  FHR: 160 bpm, variability: moderate,  accelerations:  Abscent,  decelerations:  Present earlies, intermittent variables and lates  Category/reactivity:  Category II UC:   regular, every 3-4 minutes, IUPC placed  SVE:   4-5/70/-2 Membrane status: AROM at 2025 Amniotic color: Meconium stained   Labs: Lab Results  Component Value Date   WBC 8.9 08/01/2021   HGB 12.8 08/01/2021   HCT 36.4 08/01/2021   MCV 87.9 08/01/2021   PLT 177 08/01/2021    Assessment / Plan: Induction of labor d/t postdates pregnancy  -s/p 1 dose of misoprostol  -Oxytocin turned off d/t recurrent decels  -Reviewed tracing with Dr. 13/08/2021.   -Will plan to start low dose oxytocin and assess for fetal tolerance of labor  -Discussed with Nesa and support person that a cesarean birth may be necessary if there are continued concerns despite supportive efforts   Labor:  Early labor  Fetal Wellbeing:  Category II -Overall reassuring with moderate variability  Pain Control:  Epidural I/D:   ROM x 4 hours, afebrile, GBS neg Anticipated MOD:  NSVD with the potential for primary cesarean section   Lurena Joiner, CNM 08/02/2021, 12:19 AM

## 2021-08-02 NOTE — Lactation Note (Signed)
This note was copied from a baby's chart. Lactation Consultation Note  Patient Name: Denise Mcgee [id number] Date: 08/02/2021 Reason for consult: Initial assessment;1st time breastfeeding Age:35 HOL  Lactation to the room for initial visit. Mother is holding the baby skin to skin. Encouraged feeding on demand and with cues. If baby is not cueing encouraged hand expression and skin to skin. Baby had begun to cue on Mother's chest. Taught proper technique for hand expression. Drops expressed and placed onto the tip of the shield. Baby was placed into football on the right with nipple shield. Baby is very interested in latching but then does fall asleep quickly. Baby has a rhythmic sucking pattern with some swallows noted, requires stim to feed after pausing. Colostrum noted in nipple shield. Baby was placed skin to skin with Mother, burped and was swaddled for family members to hold baby. Encouraged 8 or more attempts in the first 24 hours and 8 or more good feeds after 24 HOL. Reviewed appropriate diapers for days of life and How to know your baby is getting enough to eat. Reviewed "Understanding Postpartum and Newborn Care" booklet at bedside. [facility] # left on board, encouraged to call for any assistance. Spoons left at bedside. Mother has no further questions at this time.   Maternal Data Has patient been taught Hand Expression?: Yes Does the patient have breastfeeding experience prior to this delivery?: No  Feeding Mother's Current Feeding Choice: Breast Milk  LATCH Score Latch: Repeated attempts needed to sustain latch, nipple held in mouth throughout feeding, stimulation needed to elicit sucking reflex.  Audible Swallowing: A few with stimulation  Type of Nipple: Flat (using nipple shield, R slightly erects more than L)  Comfort (Breast/Nipple): Soft / non-tender  Hold (Positioning): Assistance needed to correctly position infant at breast and maintain latch.  LATCH Score:  6   Lactation Tools Discussed/Used Tools: Nipple Shields Nipple shield size: 20  Interventions Interventions: Breast feeding basics reviewed;Assisted with latch;Skin to skin;Hand express;Reverse pressure;Breast compression;Adjust position;Support pillows;Position options;Education  Discharge Pump: Personal (Has a Spectra S9 for home)  Consult Status Consult Status: Follow-up Date: 08/02/21 Follow-up type: In-patient    Merelyn Klump D Airelle Everding 08/02/2021, 1:53 PM

## 2021-08-02 NOTE — Op Note (Signed)
Cesarean Section Procedure Note  Indications: non-reassuring fetal status  Pre-operative Diagnosis: 41 week 2 day pregnancy, non-reassuring fetal tracing (repetitive late decelerations).  Post-operative Diagnosis: Same  Surgeon: Hildred Laser, MD  Assistants:  Margaretmary Eddy, CNM.  An experienced assistant was required given the standard of surgical care given the complexity of the case.  This assistant was needed for exposure, dissection, suctioning, retraction, instrument exchange, and for overall help during the procedure.  Procedure: Primary low transverse Cesarean Section  Anesthesia: Epidural anesthesia  Findings: Female infant, cephalic presentation, 7 lb 9 oz, with Apgar scores of 8 at one minute and 8 at five minutes. Intact placenta with 3 vessel cord.  Thick meconium amniotic fluid. Nuchal cord x 2, reducible. The uterine outline, tubes and ovaries appeared normal.   Procedure Details: The patient was seen in the Holding Room. The risks, benefits, complications, treatment options, and expected outcomes were discussed with the patient.  The patient concurred with the proposed plan, giving informed consent.  The site of surgery properly noted/marked. The patient was taken to the Operating Room, identified as Denise Mcgee and the procedure verified as C-Section Delivery.   The epidural anesthesia currently in place was dosed for preparation of a Cesarean Section.  The patient was prepped and draped in the usual sterile manner. Anesthesia was tested and noted to be adequate. A Time Out was held and the above information confirmed.  A Pfannenstiel incision was made and carried down through the subcutaneous tissue to the fascia. Fascial incision was made and extended transversely. The fascia was separated from the underlying rectus tissue superiorly and inferiorly. The peritoneum was identified and entered. Peritoneal incision was extended longitudinally. The surgical assist was able  to provide retraction to allow for clear visualization of surgical site. The utero-vesical peritoneal reflection was incised transversely and the bladder flap was bluntly freed from the lower uterine segment. A low transverse uterine incision was made. Delivered from cephalic presentation was a 7 lb 9 oz Female with Apgar scores of 8 at one minute and 8 at five minutes.  The assistant was able to apply adequate fundal pressure to allow for successful delivery of the fetus. After the umbilical cord was clamped and cut, cord blood was obtained for evaluation. The placenta was removed intact and appeared normal. The uterus was exteriorized and cleared of all clots and debris. The uterine outline, tubes and ovaries appeared normal.  The uterine incision was closed with running locked sutures of 0-Vicryl.  A second suture of 0-Vicryl was used in an imbricating layer.  Hemostasis was observed. The uterus was then returned to the abdomen. The pericolic gutters were cleared of all clots and debris. The peritoneum was reapproximated with a running suture of 3-0 Vicryl. The muscle layer was reapproximated with interrupted sutures of 3-0 Vicryl. The fascia was then grasped with Denise Mcgee and Denise Mcgee clamps, and injected with a total of 50 ml of 1.3% Exparel solution (20 ml of bupivicaine liposomal mixed with 30 ml of 0.5% Marcaine and diluted in 50 ml of normal saline).  The fascia was then reapproximated with a running suture of 0-Vicryl. The subcutaneous fat layer was reapproximated with 2-0 Vicryl. The skin was reapproximated with 4-0 Monocryl. The skin and subcutaneous tissues were then injected with an additional 35 ml of the Exparel solution. The incision was covered with steri-strips and a pressure dressing.   Instrument, sponge, and needle counts were correct prior the abdominal closure and at the conclusion of the case.  Estimated Blood Loss:  850 ml      Drains: foley catheter to gravity drainage, 200 ml of clear  urine at end of the procedure         Total IV Fluids: 1250 ml  Specimens: Cord blood, sent for analysis         Implants: None         Complications:  None; patient tolerated the procedure well.         Disposition: PACU - hemodynamically stable.         Condition: stable   Hildred Laser, MD [facility]

## 2021-08-03 ENCOUNTER — Encounter: Payer: Self-pay | Admitting: Obstetrics and Gynecology

## 2021-08-03 LAB — CBC
HCT: 27.9 % — ABNORMAL LOW (ref 36.0–46.0)
Hemoglobin: 9.3 g/dL — ABNORMAL LOW (ref 12.0–15.0)
MCH: 30 pg (ref 26.0–34.0)
MCHC: 33.3 g/dL (ref 30.0–36.0)
MCV: 90 fL (ref 80.0–100.0)
Platelets: 136 10*3/uL — ABNORMAL LOW (ref 150–400)
RBC: 3.1 MIL/uL — ABNORMAL LOW (ref 3.87–5.11)
RDW: 13 % (ref 11.5–15.5)
WBC: 12.2 10*3/uL — ABNORMAL HIGH (ref 4.0–10.5)
nRBC: 0 % (ref 0.0–0.2)

## 2021-08-03 LAB — TYPE AND SCREEN
ABO/RH(D): O NEG
Antibody Screen: POSITIVE
DAT, IgG: NEGATIVE
Unit division: 0
Unit division: 0
Weak D: POSITIVE

## 2021-08-03 LAB — BPAM RBC
Blood Product Expiration Date: 202211282359
Blood Product Expiration Date: 202211282359
Unit Type and Rh: 9500
Unit Type and Rh: 9500

## 2021-08-03 LAB — CREATININE, SERUM
Creatinine, Ser: 0.62 mg/dL (ref 0.44–1.00)
GFR, Estimated: >60 mL/min (ref >60)

## 2021-08-03 LAB — KLEIHAUER-BETKE STAIN
# Vials RhIg: 1
Fetal Cells %: 0 %
Quantitation Fetal Hemoglobin: 0 mL

## 2021-08-03 MED ORDER — KETOROLAC TROMETHAMINE 30 MG/ML IJ SOLN: 30.0000 mg | Freq: Four times a day (QID) | INTRAMUSCULAR | Status: AC

## 2021-08-03 MED ORDER — IBUPROFEN 600 MG PO TABS
600.0000 mg | ORAL_TABLET | Freq: Four times a day (QID) | ORAL | Status: DC
  Administered 2021-08-03 – 2021-08-05 (×8): 600 mg via ORAL
  Filled 2021-08-03 (×8): qty 1

## 2021-08-03 MED ORDER — RHO D IMMUNE GLOBULIN 1500 UNIT/2ML IJ SOSY
300.0000 ug | PREFILLED_SYRINGE | Freq: Once | INTRAMUSCULAR | Status: AC
  Administered 2021-08-03: 300 ug via INTRAVENOUS
  Filled 2021-08-03: qty 2

## 2021-08-03 NOTE — Progress Notes (Signed)
OB/GYN Post-op Check  Subjective: Patient reports tolerating PO, + flatus, and no problems voiding.  Pain well controlled.   Objective: I have reviewed patient's vital signs, intake and output, and medications.  General: alert and no distress Resp: clear to auscultation bilaterally Cardio: regular rate and rhythm, S1, S2 normal, no murmur, click, rub or gallop GI: soft, non-tender; bowel sounds normal; no masses,  no organomegaly and incision: clean, dry, and intact Extremities: extremities normal, atraumatic, no cyanosis or edema Vaginal Bleeding: appropriate lochia   CBC Latest Ref Rng & Units 08/03/2021 08/01/2021  WBC 4.0 - 10.5 K/uL 12.2(H) 8.9  Hemoglobin 12.0 - 15.0 g/dL 1.5(A) 30.9  Hematocrit 36.0 - 46.0 % 27.9(L) 36.4  Platelets 150 - 400 K/uL 136(L) 177    Assessment/Plan: Doing well postpartum Continue routine postpartum/post-op care   LOS: 2 days    Hildred Laser 08/03/2021, 3:09 PM

## 2021-08-03 NOTE — Anesthesia Postprocedure Evaluation (Signed)
Anesthesia Post Note  Patient: Denise Mcgee  Procedure(s) Performed: CESAREAN SECTION  Patient location during evaluation: Mother Baby Anesthesia Type: Epidural Level of consciousness: awake and alert Pain management: pain level controlled Vital Signs Assessment: post-procedure vital signs reviewed and stable Respiratory status: spontaneous breathing, nonlabored ventilation and respiratory function stable Cardiovascular status: stable Postop Assessment: no headache, no backache and epidural receding Anesthetic complications: no   No notable events documented.   Last Vitals:  Vitals:   08/02/21 2302 08/03/21 0729  BP: 109/76 115/71  Pulse: 70 66  Resp: 20 18  Temp: 36.4 C   SpO2: 99% 98%    Last Pain:  Vitals:   08/03/21 0750  TempSrc:   PainSc: 0-No pain                 Deshanta Lady,  Alessandra Bevels

## 2021-08-03 NOTE — Anesthesia Post-op Follow-up Note (Signed)
  Anesthesia Pain Follow-up Note  Patient: Denise Mcgee  Day #: 1  Date of Follow-up: 08/03/2021 Time: 9:21 AM  Last Vitals:  Vitals:   08/02/21 2302 08/03/21 0729  BP: 109/76 115/71  Pulse: 70 66  Resp: 20 18  Temp: 36.4 C   SpO2: 99% 98%    Level of Consciousness: alert  Pain: none   Side Effects:None  Catheter Site Exam:clean, dry, no drainage  Anti-Coag Meds (From admission, onward)   Start     Dose/Rate Route Frequency Ordered Stop   08/03/21 1000  enoxaparin (LOVENOX) injection 40 mg        40 mg Subcutaneous Every 24 hours 08/02/21 1221         Plan: D/C from anesthesia care at surgeon's request  Janeth Terry,  Alessandra Bevels

## 2021-08-03 NOTE — Progress Notes (Signed)
Post Partum Day 1  Subjective: Doing well, no concerns. Ambulating without difficulty, pain managed with PO meds, tolerating regular diet, and voiding without difficulty.   No fever/chills, chest pain, shortness of breath, nausea/vomiting, or leg pain. No nipple or breast pain. No headache, visual changes, or RUQ/epigastric pain.  Objective: BP 115/71 (BP Location: Right Arm)   Pulse 66   Temp 97.6 F (36.4 C)   Resp 18   Ht 5\' 3"  (1.6 m)   Wt 69.4 kg   LMP 09/18/2020 (Exact Date)   SpO2 98%   Breastfeeding Yes   BMI 27.10 kg/m    Physical Exam:  General: alert and cooperative Breasts: soft/nontender, nipple abrasions/excoriation CV: RRR Pulm: nl effort Abdomen: soft, non-tender Uterine Fundus: firm Incision: Pressure dressing on with no visible drainage Perineum: intact Lochia: appropriate DVT Evaluation: No evidence of DVT seen on physical exam. Edinburgh:  Edinburgh Postnatal Depression Scale Screening Tool 08/02/2021  I have been able to laugh and see the funny side of things. 0  I have looked forward with enjoyment to things. 0  I have blamed myself unnecessarily when things went wrong. 2  I have been anxious or worried for no good reason. 1  I have felt scared or panicky for no good reason. 1  Things have been getting on top of me. 2  I have been so unhappy that I have had difficulty sleeping. 0  I have felt sad or miserable. 0  I have been so unhappy that I have been crying. 0  The thought of harming myself has occurred to me. 0  Edinburgh Postnatal Depression Scale Total 6     Recent Labs    08/01/21 0528 08/03/21 0302  HGB 12.8 9.3*  HCT 36.4 27.9*  WBC 8.9 12.2*  PLT 177 136*    Assessment/Plan: 35 y.o. G1P1001 postpartum day # 1  -Continue routine postpartum care -Lactation consult PRN for breastfeeding and nipple abrasions   -Acute blood loss anemia - hemodynamically stable and asymptomatic; start PO ferrous sulfate BID with stool softeners   -Immunization status: all immunizations up to date  Disposition: Continue inpatient postpartum care    LOS: 2 days   Delma Drone, CNM 08/03/2021, 9:28 AM

## 2021-08-03 NOTE — Lactation Note (Signed)
This note was copied from a baby's chart. Lactation Consultation Note  Patient Name: Denise Mcgee [id number] Date: 08/03/2021 Reason for consult: Follow-up assessment;Primapara;Term;Hyperbilirubinemia Age:34 hours  Lactation follow-up. Mom still needing support with positioning of infant at the breast. Doing well maintaining baby under bili lights/blanket during feedings and immediately after when finished. Parents have noticed an increase in baby being alert and awake this morning versus yesterday, and having more difficulty with comfort under the lights. LC worked with mom on trial of new position for cross-cradle hold on L breast. Baby came to breast easily and grasped with flanged top/bottom lip; although did tuck bottom lip during the feeding. Baby active for 12 minutes, removed from breast calmly and placed back in bassinet. Mom likes the cross-cradle position, feels she has more control over baby and breast massage/compression. We discussed continued use of coconut oil and comfort gels for healing of damaged nipples, possibility of pumping for additional stimulation and building of milk supply, and the impact that elevated bili levels can have on baby's ability to stay awake at the breast. Encouraged to call for support throughout the day as needed for feedings and with questions.  Maternal Data Has patient been taught Hand Expression?: Yes Does the patient have breastfeeding experience prior to this delivery?: No  Feeding Mother's Current Feeding Choice: Breast Milk  LATCH Score Latch: Grasps breast easily, tongue down, lips flanged, rhythmical sucking.  Audible Swallowing: A few with stimulation  Type of Nipple: Flat  Comfort (Breast/Nipple): Filling, red/small blisters or bruises, mild/mod discomfort  Hold (Positioning): Assistance needed to correctly position infant at breast and maintain latch.  LATCH Score: 6   Lactation Tools Discussed/Used Tools: Nipple  Shields Nipple shield size: 20  Interventions Interventions: Breast feeding basics reviewed;Assisted with latch;Hand express;Adjust position;Support pillows;Position options;Education  Discharge    Consult Status Consult Status: Follow-up Date: 08/03/21 Follow-up type: Call as needed    Danford Bad 08/03/2021, 9:52 AM

## 2021-08-03 NOTE — Lactation Note (Signed)
This note was copied from a baby's chart. Lactation Consultation Note  Patient Name: Denise Mcgee [id number] Date: 08/03/2021 Reason for consult: Follow-up assessment;Mother's request;Primapara;Term;Hyperbilirubinemia;Other (Comment) (Coombs+) Age:34 hours  Lactation follow-up per Mom and RN's request. Baby out from under lights for attempted feeding. Baby brought to mom and mom did well w/ positioning and latching on her own, baby active for just under 10 minutes; colostrum noted in shield. We discussed the possible benefit of a pacifier introduction at this time with baby being separated and under lights for comfort. We discussed how to properly use the pacifier, when to implement and when not to, along with continuing to identify and distinguish hunger cues. Parents verbalize understanding and will consider this as an option to help soothe baby.  Maternal Data Has patient been taught Hand Expression?: Yes Does the patient have breastfeeding experience prior to this delivery?: No  Feeding Mother's Current Feeding Choice: Breast Milk  LATCH Score Latch: Grasps breast easily, tongue down, lips flanged, rhythmical sucking.  Audible Swallowing: A few with stimulation  Type of Nipple: Flat  Comfort (Breast/Nipple): Filling, red/small blisters or bruises, mild/mod discomfort  Hold (Positioning): No assistance needed to correctly position infant at breast.  LATCH Score: 7   Lactation Tools Discussed/Used Tools: Nipple Shields Nipple shield size: 20  Interventions Interventions: Breast feeding basics reviewed;Position options;Support pillows;Education  Discharge    Consult Status Consult Status: Follow-up Date: 08/03/21 Follow-up type: Call as needed    Danford Bad 08/03/2021, 2:04 PM

## 2021-08-04 LAB — RHOGAM INJECTION: Unit division: 0

## 2021-08-04 NOTE — Lactation Note (Signed)
This note was copied from a baby's chart. Lactation Consultation Note  Patient Name: Denise Mcgee OVZCH'Y Date: 08/04/2021   Age:34 hours  Lactation follow-up. Parents report continuing to BF overnight, elevated bilirubin levels and on phototherapy. LC observed feeding around 0955, after baby had already fed for 20 minutes. At end of feeding it wasn't evident that there was colostrum in shield. RN previously spoke with parents re: supplement due to no void in almost 24 hours. Parents agreeable to donor breast milk at this time.  32mL DBM warmed, provided via bottle. Dad instructed on paced bottle feeding, LC present throughout feeding, baby tolerated well.  Feeding plan: -At breast first minimum every 3 hours -DBM minimum 53mL post every feeding -Mom to begin pumping.  Maternal Data    Feeding Nipple Type: Slow - flow  LATCH Score                    Lactation Tools Discussed/Used    Interventions    Discharge    Consult Status      Danford Bad 08/04/2021, 11:01 AM

## 2021-08-04 NOTE — Progress Notes (Addendum)
Postop Day  2  Subjective: no complaints, up ad lib, voiding, tolerating PO, and + flatus  Doing well, no concerns. Ambulating without difficulty, pain managed with PO meds, tolerating regular diet, and voiding without difficulty.   No fever/chills, chest pain, shortness of breath, nausea/vomiting, or leg pain. No nipple or breast pain. No headache, visual changes, or RUQ/epigastric pain.  Objective: BP 111/78 (BP Location: Left Arm)   Pulse 70   Temp 98 F (36.7 C) (Oral)   Resp 20   Ht 5\' 3"  (1.6 m)   Wt 69.4 kg   LMP 09/18/2020 (Exact Date)   SpO2 100%   Breastfeeding Yes   BMI 27.10 kg/m    Physical Exam:  General: alert, cooperative, and appears stated age Breasts:abrasions, now using nipple shield for each feeding CV: RRR Pulm: nl effort, CTABL Abdomen: soft, non-tender, active bowel sounds Uterine Fundus: firm Incision: healing well, no significant drainage, no significant erythema Perineum: minimal edema, intact Lochia: appropriate DVT Evaluation: No evidence of DVT seen on physical exam. Negative Homan's sign.  Recent Labs    08/03/21 0302  HGB 9.3*  HCT 27.9*  WBC 12.2*  PLT 136*    Assessment/Plan: 35 y.o. G1P1001 postpartum day # 2  -Continue routine postpartum care -Lactation consult PRN for breastfeeding -Discussed contraceptive options including implant, IUDs hormonal and non-hormonal, injection, pills/ring/patch, condoms, and NFP.  -Acute blood loss anemia - hemodynamically stable and asymptomatic; start PO ferrous sulfate BID with stool softeners  -Immunization status:   all immunizations up to date   Disposition: Continue inpatient postpartum care. Patient does not desire discharge home today   LOS: 3 days    ----- 20 Certified Nurse Midwife [facility]

## 2021-08-05 MED ORDER — IBUPROFEN 600 MG PO TABS: 600.0000 mg | ORAL_TABLET | Freq: Four times a day (QID) | ORAL | 0 refills | Status: DC

## 2021-08-05 MED ORDER — FERROUS SULFATE 325 (65 FE) MG PO TABS: 325.0000 mg | ORAL_TABLET | Freq: Two times a day (BID) | ORAL | 0 refills | Status: AC

## 2021-08-05 MED ORDER — SENNOSIDES-DOCUSATE SODIUM 8.6-50 MG PO TABS: 2.0000 | ORAL_TABLET | Freq: Every day | ORAL | 0 refills | Status: DC

## 2021-08-05 MED ORDER — ACETAMINOPHEN 500 MG PO TABS: 1000.0000 mg | ORAL_TABLET | Freq: Four times a day (QID) | ORAL | 0 refills | Status: AC

## 2021-08-05 MED ORDER — SIMETHICONE 80 MG PO CHEW: 80.0000 mg | CHEWABLE_TABLET | ORAL | 0 refills | Status: DC | PRN

## 2021-08-05 MED ORDER — TRAMADOL HCL 50 MG PO TABS: 50.0000 mg | ORAL_TABLET | Freq: Four times a day (QID) | ORAL | Status: AC | PRN

## 2021-08-05 MED ORDER — COCONUT OIL OIL: 1.0000 "application " | TOPICAL_OIL | 0 refills | Status: AC | PRN

## 2021-08-05 MED ORDER — GABAPENTIN 300 MG PO CAPS: 300.0000 mg | ORAL_CAPSULE | Freq: Two times a day (BID) | ORAL | 0 refills | Status: AC

## 2021-08-05 NOTE — Lactation Note (Signed)
This note was copied from a baby's chart. Lactation Consultation Note  Patient Name: Denise Mcgee [id number] Date: 08/05/2021 Reason for consult: Follow-up assessment Age:33 hours  Lactation follow-up. Parents confident in feeding plan for baby and ready for discharge. Mother stated feeling of fullness in breasts has started no other changes present. Parents recorded a 0915 bottle feed of 86ml. Plan to watch for hunger cues, feed on demand, and pump once at home to assist in milk production if needed for supplementation, prefers breast milk. LC educated benefits of fresh air and continued feedings to decrease duration of juandice. LC provided support information and useful technology apps for help with recording feed/intake/output etc. LC offered outpatient services in the event of any concerns.   Maternal Data Has patient been taught Hand Expression?: Yes Does the patient have breastfeeding experience prior to this delivery?: No  Feeding Mother's Current Feeding Choice: Breast Milk Nipple Type: Extra Slow Flow  LATCH Score                    Lactation Tools Discussed/Used    Interventions Interventions: Breast feeding basics reviewed;Education  Discharge Discharge Education: Outpatient recommendation;Warning signs for feeding baby Pump: Personal  Consult Status Consult Status: Complete    Danford Bad 08/05/2021, 9:38 AM

## 2021-08-05 NOTE — Progress Notes (Signed)
Patient discharged home with family.  Discharge instructions, when to follow up, and prescriptions reviewed with patient.  Patient verbalized understanding. Patient will be escorted out by auxiliary.   

## 2022-06-08 IMAGING — RF DG HYSTEROGRAM
6 series · 6 of 6 positions shown · IV contrast (omnipaque)
Comparison: No prior.

CLINICAL DATA: Procreation management investigation and testing.

EXAM:
HYSTEROSALPINGOGRAM
TECHNIQUE: Following cleansing of the cervix and vagina with Betadine solution,
a hysterosalpingogram was performed using a 5-French
hysterosalpingogram catheter and Omnipaque 300 contrast. The patient
tolerated the examination without difficulty.

[Series 1: fluoro_hsg_singleshot_bw · 0.17mm/px · 1 of 1 slices shown (1 of 6)]
[im 1/1]
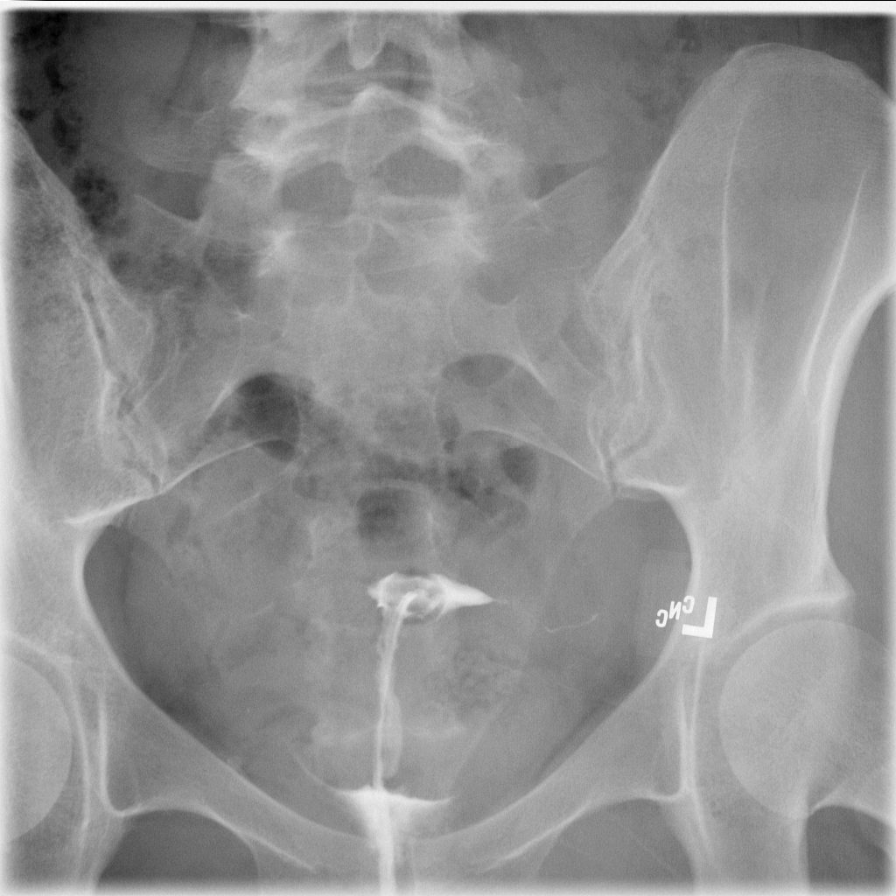

[Series 2: fluoro_hsg_singleshot_bw · 0.17mm/px · 1 of 1 slices shown (2 of 6)]
[im 1/1]
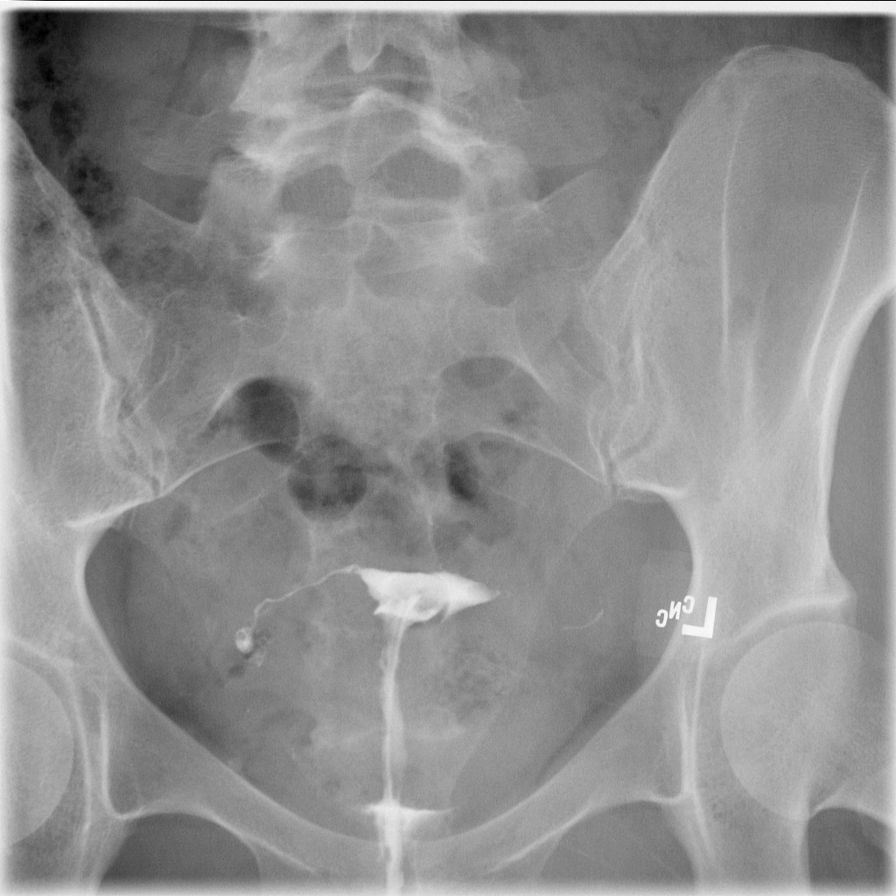

[Series 3: fluoro_hsg_singleshot_bw · 0.17mm/px · 1 of 1 slices shown (3 of 6)]
[im 1/1]
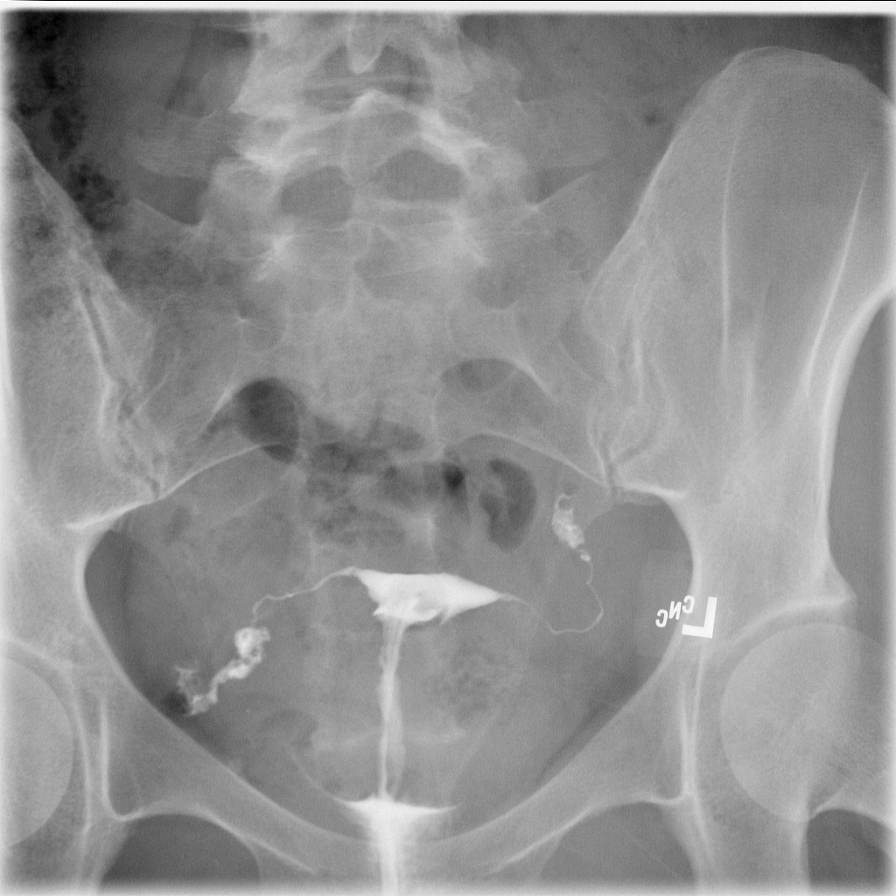

[Series 4: fluoro_hsg_singleshot_bw · 0.17mm/px · 1 of 1 slices shown (4 of 6)]
[im 1/1]
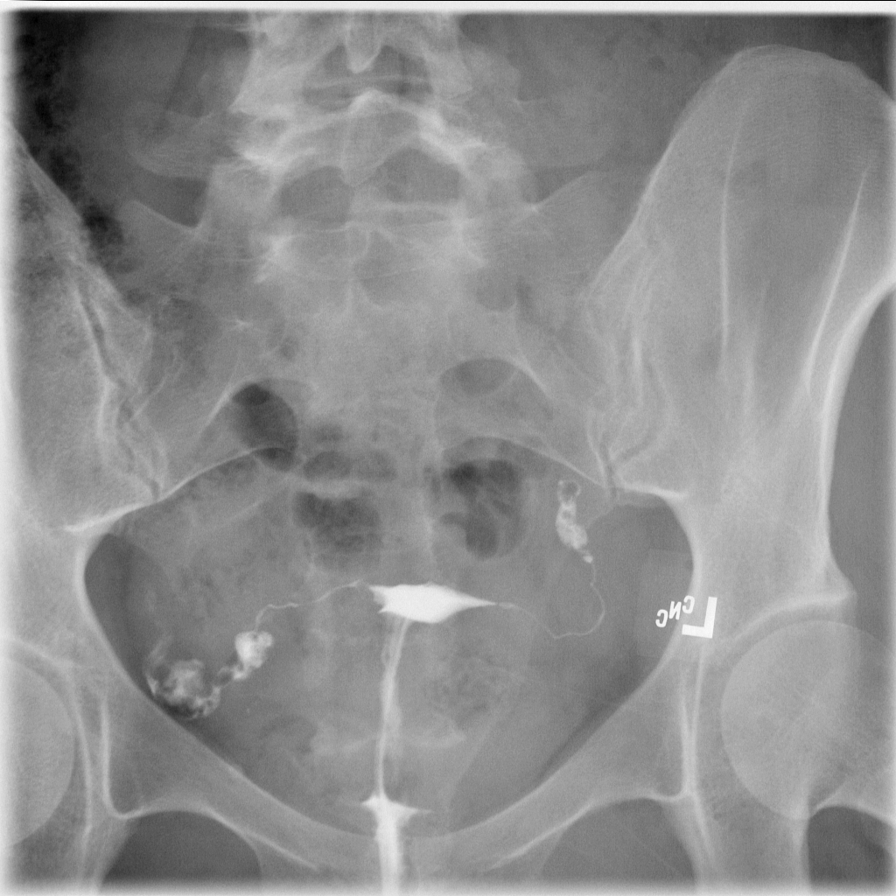

[Series 5: fluoro_hsg_singleshot_bw · 0.17mm/px · 1 of 1 slices shown (5 of 6)]
[im 1/1]
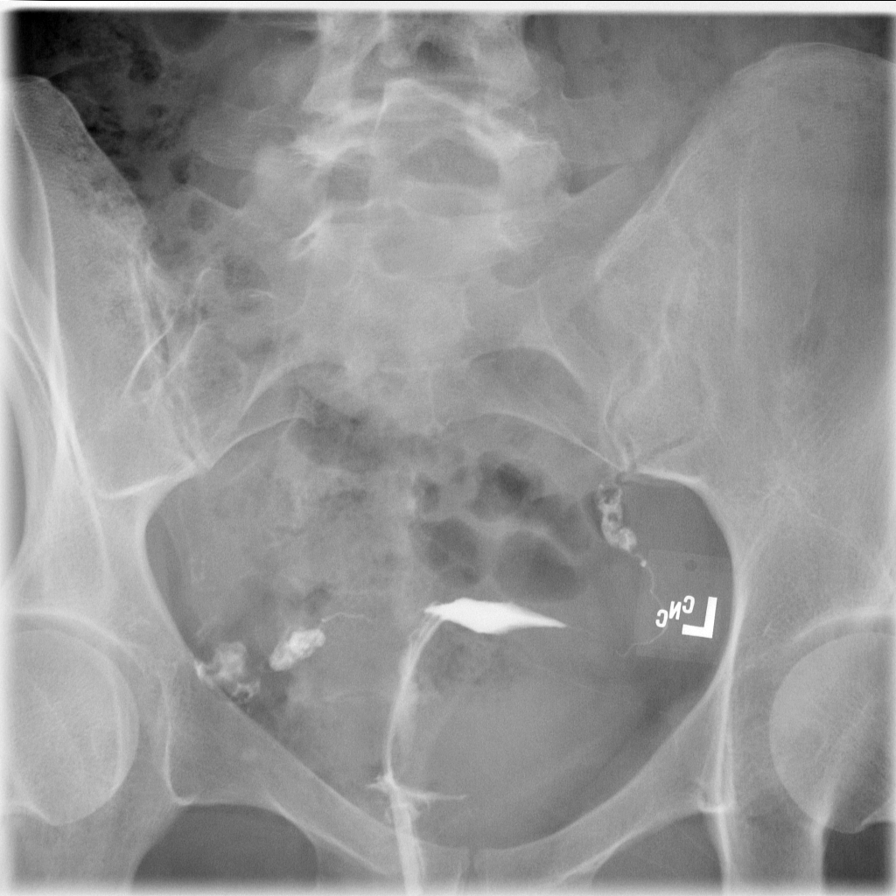

[Series 6: fluoro_hsg_singleshot_bw · 0.17mm/px · 1 of 1 slices shown (6 of 6)]
[im 1/1]
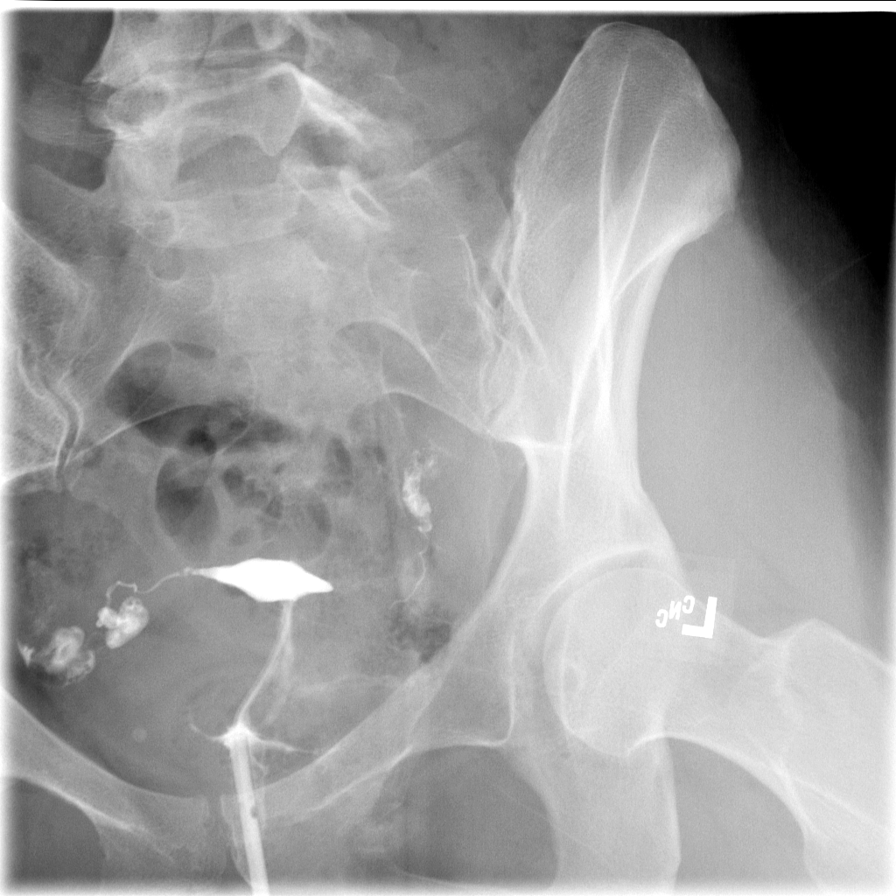

[6 of 6 positions shown; findings below may reference images not displayed]

FLUOROSCOPY TIME:  Radiation Exposure Index (as provided by the
fluoroscopic device): 15.7 mGy

Fluoroscopy Time:  15.7 mGy 1 minutes 6 seconds
FINDINGS: Uterus and fallopian tubes are widely patent. Bilateral symmetric
spill noted. No focal abnormality identified.
IMPRESSION: Normal exam.

## 2023-03-17 DIAGNOSIS — O0993 Supervision of high risk pregnancy, unspecified, third trimester: Secondary | ICD-10-CM | POA: Insufficient documentation

## 2023-03-18 LAB — OB RESULTS CONSOLE GC/CHLAMYDIA
Chlamydia: NEGATIVE
Neisseria Gonorrhea: NEGATIVE

## 2023-03-18 LAB — OB RESULTS CONSOLE RUBELLA ANTIBODY, IGM: Rubella: IMMUNE

## 2023-03-18 LAB — OB RESULTS CONSOLE VARICELLA ZOSTER ANTIBODY, IGG: Varicella: IMMUNE

## 2023-03-18 LAB — OB RESULTS CONSOLE HIV ANTIBODY (ROUTINE TESTING): HIV: NONREACTIVE

## 2023-03-18 LAB — OB RESULTS CONSOLE HEPATITIS B SURFACE ANTIGEN: Hepatitis B Surface Ag: NEGATIVE

## 2023-03-18 LAB — OB RESULTS CONSOLE RPR: RPR: NONREACTIVE

## 2023-09-13 ENCOUNTER — Encounter: Payer: Self-pay | Admitting: Emergency Medicine

## 2023-09-13 ENCOUNTER — Ambulatory Visit
Admission: EM | Admit: 2023-09-13 | Discharge: 2023-09-13 | Disposition: A | Discharge status: Home / Self Care | Payer: BC Managed Care – PPO | Attending: Emergency Medicine | Admitting: Emergency Medicine

## 2023-09-13 DIAGNOSIS — L989 Disorder of the skin and subcutaneous tissue, unspecified: Secondary | ICD-10-CM

## 2023-09-13 MED ORDER — CEPHALEXIN 500 MG PO CAPS: 500.0000 mg | ORAL_CAPSULE | Freq: Three times a day (TID) | ORAL | 0 refills | Status: AC

## 2023-09-13 NOTE — ED Triage Notes (Signed)
Pt presents with a bump in the back of her head for several months. She scratched it yesterday and it started to drain and is throbbing.

## 2023-09-13 NOTE — Discharge Instructions (Addendum)
I am going to put you on Keflex 500 mg 3 times a day with food to cover for potential infection of your scalp lesion.  I have also [facility] and treatment of your scalp lesion.  As we discussed, this might be an infected sebaceous cyst, dermal inclusion cyst, or a more significant scalp lesion.  If you develop any fever, pus drainage, increasing redness or swelling to your scalp, or increased pain I recommend you seek reevaluation in the emergency department.

## 2023-09-13 NOTE — ED Provider Notes (Signed)
[facility]    CSN: [id number] Arrival date & time: 09/13/23  0803      History   Chief Complaint Chief Complaint  Patient presents with   Bump     HPI Denise Mcgee is a 37 y.o. female.   HPI  37 year old female who is currently 38 weeks 6 days gestation with no other significant past medical history presents for evaluation of a scalp lesion.  She reports that she has several lesions on her scalp that will occasionally be scratched open by her hairbrush and drain.  She currently has 1 in her right occipital area that was scratched open yesterday and she is concerned because the area is draining a sanguinous discharge and is throbbing in nature.  History reviewed. No pertinent past medical history.  Patient Active Problem List   Diagnosis Date Noted   Encounter for planned induction of labor 08/01/2021   Normal pregnancy, third trimester 01/19/2021    Past Surgical History:  Procedure Laterality Date   CESAREAN SECTION  08/02/2021   Procedure: CESAREAN SECTION;  Surgeon: Hildred Laser, MD;  Location: [facility] ORS;  Service: Obstetrics;;   HYSTEROSALPINGOGRAM  09/25/2020        OB History     Gravida  2   Para  1   Term  1   Preterm  0   AB  0   Living  1      SAB  0   IAB  0   Ectopic  0   Multiple  0   Live Births  1            Home Medications    Prior to Admission medications   Medication Sig Start Date End Date Taking? Authorizing Provider  cephALEXin (KEFLEX) 500 MG capsule Take 1 capsule (500 mg total) by mouth 3 (three) times daily for 7 days. 09/13/23 09/20/23 Yes Becky Augusta, NP  acetaminophen (TYLENOL) 500 MG tablet Take 2 tablets (1,000 mg total) by mouth every 6 (six) hours. 08/05/21   McVey, Prudencio Pair, CNM  coconut oil OIL Apply 1 application topically as needed. 08/05/21   McVey, Prudencio Pair, CNM  ferrous sulfate 325 (65 FE) MG tablet Take 1 tablet (325 mg total) by mouth 2 (two) times daily with a meal. 08/05/21    McVey, Prudencio Pair, CNM  gabapentin (NEURONTIN) 300 MG capsule Take 1 capsule (300 mg total) by mouth 2 (two) times daily for 3 days. 08/05/21 08/08/21  McVey, Prudencio Pair, CNM  ibuprofen (ADVIL) 600 MG tablet Take 1 tablet (600 mg total) by mouth every 6 (six) hours. 08/05/21   McVey, Prudencio Pair, CNM  Prenatal Vit-Fe Fumarate-FA (PRENATAL MULTIVITAMIN) TABS tablet Take 1 tablet by mouth daily at 12 noon.    [provider]  senna-docusate (SENOKOT-S) 8.6-50 MG tablet Take 2 tablets by mouth daily. 08/05/21   McVey, Prudencio Pair, CNM  simethicone (MYLICON) 80 MG chewable tablet Chew 1 tablet (80 mg total) by mouth as needed for flatulence. 08/05/21   McVey, Prudencio Pair, CNM    Family History History reviewed. No pertinent family history.  Social History Social History   Tobacco Use   Smoking status: Never    Passive exposure: Never   Smokeless tobacco: Never  Vaping Use   Vaping status: Never Used  Substance Use Topics   Alcohol use: Never     Allergies   Other   Review of Systems Review of Systems  Constitutional:  Negative for fever.  Skin:        Scalp lesion     Physical Exam Triage Vital Signs ED Triage Vitals  Encounter Vitals Group     BP      Systolic BP Percentile      Diastolic BP Percentile      Pulse      Resp      Temp      Temp src      SpO2      Weight      Height      Head Circumference      Peak Flow      Pain Score      Pain Loc      Pain Education      Exclude from Growth Chart    No data found.  Updated Vital Signs BP 123/77 (BP Location: Right Arm)   Pulse 78   Temp 97.9 F (36.6 C) (Oral)   Resp 18   SpO2 99%   Visual Acuity Right Eye Distance:   Left Eye Distance:   Bilateral Distance:    Right Eye Near:   Left Eye Near:    Bilateral Near:     Physical Exam Vitals and nursing note reviewed.  Constitutional:      Appearance: Normal appearance. She is not ill-appearing.  HENT:     Head: Normocephalic and  atraumatic.  Skin:    General: Skin is warm and dry.     Capillary Refill: Capillary refill takes less than 2 seconds.     Findings: Erythema and lesion present.  Neurological:     General: No focal deficit present.     Mental Status: She is alert and oriented to person, place, and time.      UC Treatments / Results  Labs (all labs ordered are listed, but only abnormal results are displayed) Labs Reviewed - No data to display  EKG   Radiology No results found.  Procedures Procedures (including critical care time)  Medications Ordered in UC Medications - No data to display  Initial Impression / Assessment and Plan / UC Course  I have reviewed the triage vital signs and the nursing notes.  Pertinent labs & imaging results that were available during my care of the patient were reviewed by me and considered in my medical decision making (see chart for details).   Patient is a pleasant, nontoxic-appearing 37 year old female presenting for evaluation of bleeding scalp lesion that started yesterday.  These Lesions are an ongoing issue and have been present for quite a while.  She came in today because she is currently pregnant and the area is more sore and has a throbbing discomfort to it which is unusual.  As you can see the image above, there is a raised, erythematous, dome with an opening in the center that is draining a thick sanguinous discharge.  The area is warm to touch.  It is difficult to tell if this is uninfected dermoid cyst, sebaceous cyst, or a more concerning scalp lesion given its raised, large size.  It is approximately 6 mm x 5 mm.  I will cover the patient empirically with Keflex 500 mg 3 times daily x 7 days to cover for potential infection and [facility] for further evaluation and treatment.  Final Clinical Impressions(s) / UC Diagnoses   Final diagnoses:  Skin lesion of scalp     Discharge Instructions      I am going to put you  on  Keflex 500 mg 3 times a day with food to cover for potential infection of your scalp lesion.  I have also referred [facility] for further evaluation and treatment of your scalp lesion.  As we discussed, this might be an infected sebaceous cyst, dermal inclusion cyst, or a more significant scalp lesion.  If you develop any fever, pus drainage, increasing redness or swelling to your scalp, or increased pain I recommend you seek reevaluation in the emergency department.     ED Prescriptions     Medication Sig Dispense Auth. Provider   cephALEXin (KEFLEX) 500 MG capsule Take 1 capsule (500 mg total) by mouth 3 (three) times daily for 7 days. 21 capsule Becky Augusta, NP      PDMP not reviewed this encounter.   Becky Augusta, NP 09/13/23 [id number]

## 2023-09-20 ENCOUNTER — Other Ambulatory Visit: Payer: Self-pay | Admitting: Obstetrics

## 2023-09-20 DIAGNOSIS — Z3490 Encounter for supervision of normal pregnancy, unspecified, unspecified trimester: Secondary | ICD-10-CM

## 2023-09-20 NOTE — Progress Notes (Signed)
 G2P1001 at [redacted]w[redacted]d, LMP of 12/15/22, c/w early US  at [redacted]w[redacted]d.  Scheduled for Elective at term induction of labor on 09/21/23@0500 .   Prenatal provider: [facility] Pregnancy complicated by: Prior c-section Delivery preference: TOLAC VBAC caluculator 65%  GBS pos AMA Hx of PP depression 5. RH neg  Prenatal Labs: Blood type/Rh O NEG  Antibody screen neg  Rubella immune    Varicella Immune  RPR NR  HBsAg   NR  Hep C NR  HIV   NR  GC neg  Chlamydia neg  Genetic screening cfDNA negative  1 hour GTT 129  3 hour GTT   GBS   Positive   - Tdap vaccine: Given prenatally - Flu vaccine: Given prenatally -RSV vaccine:RSV Given: Given during pregnancy >/=14 days ago  Contraception:Contraceptives: Undecided Feeding preference: breast feeding  ____ Bobbette Brunswick, CNM Certified Nurse Midwife [facility]

## 2023-09-21 ENCOUNTER — Inpatient Hospital Stay: Payer: 59 | Admitting: Anesthesiology

## 2023-09-21 ENCOUNTER — Encounter: Payer: Self-pay | Admitting: *Deleted

## 2023-09-21 ENCOUNTER — Inpatient Hospital Stay
Admission: EM | Admit: 2023-09-21 | Discharge: 2023-09-24 | DRG: 806 | Disposition: A | Discharge status: Home / Self Care | Payer: 59

## 2023-09-21 ENCOUNTER — Other Ambulatory Visit: Payer: Self-pay

## 2023-09-21 DIAGNOSIS — O34219 Maternal care for unspecified type scar from previous cesarean delivery: Secondary | ICD-10-CM | POA: Diagnosis present

## 2023-09-21 DIAGNOSIS — O99824 Streptococcus B carrier state complicating childbirth: Secondary | ICD-10-CM | POA: Diagnosis present

## 2023-09-21 DIAGNOSIS — O26893 Other specified pregnancy related conditions, third trimester: Secondary | ICD-10-CM | POA: Diagnosis present

## 2023-09-21 DIAGNOSIS — O09529 Supervision of elderly multigravida, unspecified trimester: Secondary | ICD-10-CM

## 2023-09-21 DIAGNOSIS — O9081 Anemia of the puerperium: Secondary | ICD-10-CM | POA: Diagnosis not present

## 2023-09-21 DIAGNOSIS — D62 Acute posthemorrhagic anemia: Secondary | ICD-10-CM | POA: Diagnosis not present

## 2023-09-21 DIAGNOSIS — Z8659 Personal history of other mental and behavioral disorders: Secondary | ICD-10-CM

## 2023-09-21 DIAGNOSIS — Z3A40 40 weeks gestation of pregnancy: Secondary | ICD-10-CM

## 2023-09-21 DIAGNOSIS — Z3490 Encounter for supervision of normal pregnancy, unspecified, unspecified trimester: Principal | ICD-10-CM | POA: Diagnosis present

## 2023-09-21 DIAGNOSIS — O99891 Other specified diseases and conditions complicating pregnancy: Secondary | ICD-10-CM

## 2023-09-21 DIAGNOSIS — B951 Streptococcus, group B, as the cause of diseases classified elsewhere: Secondary | ICD-10-CM

## 2023-09-21 DIAGNOSIS — Z98891 History of uterine scar from previous surgery: Secondary | ICD-10-CM

## 2023-09-21 DIAGNOSIS — Z6791 Unspecified blood type, Rh negative: Secondary | ICD-10-CM

## 2023-09-21 DIAGNOSIS — Z79899 Other long term (current) drug therapy: Secondary | ICD-10-CM

## 2023-09-21 LAB — CBC
HCT: 35.6 % — ABNORMAL LOW (ref 36.0–46.0)
Hemoglobin: 12.3 g/dL (ref 12.0–15.0)
MCH: 29.6 pg (ref 26.0–34.0)
MCHC: 34.6 g/dL (ref 30.0–36.0)
MCV: 85.6 fL (ref 80.0–100.0)
Platelets: 179 10*3/uL (ref 150–400)
RBC: 4.16 MIL/uL (ref 3.87–5.11)
RDW: 13.2 % (ref 11.5–15.5)
WBC: 8.5 10*3/uL (ref 4.0–10.5)
nRBC: 0 % (ref 0.0–0.2)

## 2023-09-21 LAB — TYPE AND SCREEN
ABO/RH(D): O POS
Antibody Screen: NEGATIVE
DAT, IgG: NEGATIVE
Weak D: POSITIVE

## 2023-09-21 LAB — RPR: RPR Ser Ql: NONREACTIVE

## 2023-09-21 MED ORDER — FENTANYL CITRATE (PF) 100 MCG/2ML IJ SOLN: 50.0000 ug | INTRAMUSCULAR | Status: DC | PRN

## 2023-09-21 MED ORDER — LIDOCAINE HCL (PF) 1 % IJ SOLN
INTRAMUSCULAR | Status: AC
  Filled 2023-09-21: qty 30

## 2023-09-21 MED ORDER — LACTATED RINGERS IV SOLN
500.0000 mL | INTRAVENOUS | Status: DC | PRN
  Administered 2023-09-22: 500 mL via INTRAVENOUS

## 2023-09-21 MED ORDER — DIPHENHYDRAMINE HCL 50 MG/ML IJ SOLN: 12.5000 mg | INTRAMUSCULAR | Status: DC | PRN

## 2023-09-21 MED ORDER — SOD CITRATE-CITRIC ACID 500-334 MG/5ML PO SOLN: 30.0000 mL | ORAL | Status: DC | PRN

## 2023-09-21 MED ORDER — ACETAMINOPHEN 325 MG PO TABS
650.0000 mg | ORAL_TABLET | ORAL | Status: DC | PRN
Start: 2023-09-21 — End: 2023-09-22

## 2023-09-21 MED ORDER — PHENYLEPHRINE 80 MCG/ML (10ML) SYRINGE FOR IV PUSH (FOR BLOOD PRESSURE SUPPORT): 80.0000 ug | PREFILLED_SYRINGE | INTRAVENOUS | Status: DC | PRN

## 2023-09-21 MED ORDER — EPHEDRINE 5 MG/ML INJ: 10.0000 mg | INTRAVENOUS | Status: DC | PRN

## 2023-09-21 MED ORDER — OXYTOCIN-SODIUM CHLORIDE 30-0.9 UT/500ML-% IV SOLN
1.0000 m[IU]/min | INTRAVENOUS | Status: DC
  Administered 2023-09-21: 2 m[IU]/min via INTRAVENOUS
  Filled 2023-09-21: qty 500

## 2023-09-21 MED ORDER — PENICILLIN G POT IN DEXTROSE 60000 UNIT/ML IV SOLN
3.0000 10*6.[IU] | INTRAVENOUS | Status: DC
  Administered 2023-09-21: 3 10*6.[IU] via INTRAVENOUS
  Filled 2023-09-21: qty 50

## 2023-09-21 MED ORDER — LIDOCAINE HCL (PF) 1 % IJ SOLN: 30.0000 mL | INTRAMUSCULAR | Status: DC | PRN

## 2023-09-21 MED ORDER — FENTANYL-BUPIVACAINE-NACL 0.5-0.125-0.9 MG/250ML-% EP SOLN
EPIDURAL | Status: AC
  Filled 2023-09-21: qty 250

## 2023-09-21 MED ORDER — LACTATED RINGERS IV SOLN
INTRAVENOUS | Status: DC
Start: 2023-09-21 — End: 2023-09-22

## 2023-09-21 MED ORDER — LIDOCAINE-EPINEPHRINE (PF) 1.5 %-1:200000 IJ SOLN
INTRAMUSCULAR | Status: DC | PRN
  Administered 2023-09-21: 3 mL via EPIDURAL

## 2023-09-21 MED ORDER — SODIUM CHLORIDE 0.9 % IV SOLN
5.0000 10*6.[IU] | Freq: Once | INTRAVENOUS | Status: AC
Start: 2023-09-21 — End: 2023-09-21
  Administered 2023-09-21: 5 10*6.[IU] via INTRAVENOUS
  Filled 2023-09-21: qty 5

## 2023-09-21 MED ORDER — AMMONIA AROMATIC IN INHA
RESPIRATORY_TRACT | Status: AC
  Filled 2023-09-21: qty 10

## 2023-09-21 MED ORDER — ONDANSETRON HCL 4 MG/2ML IJ SOLN: 4.0000 mg | Freq: Four times a day (QID) | INTRAMUSCULAR | Status: DC | PRN

## 2023-09-21 MED ORDER — LIDOCAINE HCL (PF) 1 % IJ SOLN
INTRAMUSCULAR | Status: DC | PRN
  Administered 2023-09-21: 2 mL via SUBCUTANEOUS

## 2023-09-21 MED ORDER — LACTATED RINGERS IV SOLN
500.0000 mL | Freq: Once | INTRAVENOUS | Status: AC
  Administered 2023-09-21: 500 mL via INTRAVENOUS

## 2023-09-21 MED ORDER — OXYTOCIN 10 UNIT/ML IJ SOLN
INTRAMUSCULAR | Status: AC
  Filled 2023-09-21: qty 2

## 2023-09-21 MED ORDER — FENTANYL-BUPIVACAINE-NACL 0.5-0.125-0.9 MG/250ML-% EP SOLN
12.0000 mL/h | EPIDURAL | Status: DC | PRN
  Administered 2023-09-21: 12 mL/h via EPIDURAL

## 2023-09-21 MED ORDER — MISOPROSTOL 200 MCG PO TABS
ORAL_TABLET | ORAL | Status: AC
  Filled 2023-09-21: qty 4

## 2023-09-21 MED ORDER — OXYTOCIN BOLUS FROM INFUSION
333.0000 mL | Freq: Once | INTRAVENOUS | Status: AC
  Administered 2023-09-22: 333 mL via INTRAVENOUS

## 2023-09-21 MED ORDER — TERBUTALINE SULFATE 1 MG/ML IJ SOLN: 0.2500 mg | Freq: Once | INTRAMUSCULAR | Status: DC | PRN

## 2023-09-21 MED ORDER — PENICILLIN G POT IN DEXTROSE 60000 UNIT/ML IV SOLN
3.0000 10*6.[IU] | INTRAVENOUS | Status: DC
  Administered 2023-09-21 – 2023-09-22 (×3): 3 10*6.[IU] via INTRAVENOUS
  Filled 2023-09-21 (×3): qty 50

## 2023-09-21 MED ORDER — OXYTOCIN-SODIUM CHLORIDE 30-0.9 UT/500ML-% IV SOLN
2.5000 [IU]/h | INTRAVENOUS | Status: DC
  Filled 2023-09-21: qty 500

## 2023-09-21 MED ORDER — SODIUM CHLORIDE 0.9 % IV SOLN
INTRAVENOUS | Status: DC | PRN
  Administered 2023-09-21 (×2): 5 mL via EPIDURAL

## 2023-09-21 NOTE — Progress Notes (Signed)
  Labor Progress Note  Denise Mcgee is a 38 y.o. G2P1001 at [redacted]w[redacted]d by LMP admitted for induction of labor TOLAC due to Elective at term.  Subjective: Pt is comfortable with epidural  Objective: BP 104/69   Pulse (!) 149   Temp 97.8 F (36.6 C) (Oral)   Resp 16   Ht 5' 3 (1.6 m)   Wt 68.5 kg   SpO2 99%   BMI 26.75 kg/m   Fetal Assessment: FHT:  FHR: 145 bpm, variability: moderate,  accelerations:  Present,  decelerations:  Present earlies Category/reactivity:  Category I UC:   regular, every 2-3 min SVE:    Dilation: 5.5cm  Effacement: 80-90%  Station:  -1  Consistency: soft  Position: middle  Membrane status: Intact Amniotic color: N/a  Labs: Lab Results  Component Value Date   WBC 8.5 09/21/2023   HGB 12.3 09/21/2023   HCT 35.6 (L) 09/21/2023   MCV 85.6 09/21/2023   PLT 179 09/21/2023    Assessment / Plan: Induction of labor due to term with favorable cervix and postterm 0630 Cook Cath placed 0805 Pitocin  started and GBS prophylaxis started 1845 Cooks cath deflated and removed  5/60-70/-2 2018 Epidural placed 2130 5-6/80/-1 Pitocin  currently at 10mU  Labor: Progressing normally Preeclampsia:   104/69 Fetal Wellbeing:  Category I Pain Control:  Labor support without medications I/D:   Afebrile, GBS pos - abx dose x3, Intact Anticipated MOD:  NSVD  Margery FORBES Coe, CNM 09/21/2023, 9:40 PM

## 2023-09-21 NOTE — Progress Notes (Signed)
 Pt lying on BP cuff at this time.

## 2023-09-21 NOTE — Progress Notes (Signed)
 L&D Note    Subjective:  Resting comfortably in bed with spouse at bedside  Objective:   Vitals:   09/21/23 0540 09/21/23 0542 09/21/23 0545 09/21/23 0710  BP: 116/86  101/71 119/73  Pulse: (!) 103  100 64  Resp:    16  Temp:    98 F (36.7 C)  TempSrc:    Oral  Weight:  68.5 kg    Height:  5' 3 (1.6 m)      Current Vital Signs 24h Vital Sign Ranges  T 98 F (36.7 C) Temp  Avg: 98 F (36.7 C)  Min: 98 F (36.7 C)  Max: 98 F (36.7 C)  BP 119/73 BP  Min: 101/71  Max: 119/73  HR 64 Pulse  Avg: 89  Min: 64  Max: 103  RR 16 Resp  Avg: 16  Min: 16  Max: 16  SaO2     No data recorded      Gen: alert, cooperative, no distress FHR: Baseline: 140 bpm, Variability: moderate, Accels: Present, Decels: none Toco: irritability SVE:  1 cm dilated  Medications SCHEDULED MEDICATIONS   ammonia        lidocaine  (PF)       misoprostol        oxytocin        oxytocin  40 units in LR 1000 mL  333 mL Intravenous Once    MEDICATION INFUSIONS   lactated ringers      lactated ringers  125 mL/hr at 09/21/23 0606   oxytocin      oxytocin  2 milli-units/min (09/21/23 0805)   pencillin G potassium IV 5 Million Units (09/21/23 0806)   Followed by   pencillin G potassium IV      PRN MEDICATIONS  acetaminophen , ammonia , fentaNYL  (SUBLIMAZE ) injection, lactated ringers , lidocaine  (PF), lidocaine  (PF), misoprostol , ondansetron , oxytocin , sodium citrate -citric acid , terbutaline    Assessment & Plan:  38 y.o. G2P1001 at [redacted]w[redacted]d admitted for Labor_induction_indication: Elective at term and TOLAC -GBS: positive -IP Antibiotics: abx: Penicillin  to start at 0800 -Membranes intact -Recheck: -Preeclampsia:   BP wnl -Pain: none -Intervention: IV Pitocin  induction and Cooks Cath placed with visualization of speculum. Cervical/vaginal balloon inflated with 80 cc of fluid -Analgesia: regional anesthesia and IVPM   Bobbette Aisha ED[facility]

## 2023-09-21 NOTE — Progress Notes (Signed)
  Labor Progress Note  Denise Mcgee is a 38 y.o. G2P1001 at [redacted]w[redacted]d by LMP admitted for induction of labor TOLAC due to Elective at term.  Subjective: Pt reports stronger UCs - Pt is having to breath through each contraction  Objective: BP 130/79 (BP Location: Right Arm)   Pulse 80   Temp 97.6 F (36.4 C) (Oral)   Resp 18   Ht 5' 3 (1.6 m)   Wt 68.5 kg   BMI 26.75 kg/m   Fetal Assessment: FHT:  FHR: 145 bpm, variability: moderate,  accelerations:  Present,  decelerations:  Absent Category/reactivity:  Category I UC:   regular, every 1-4 min SVE:    Dilation: 1.5cm  Effacement: ---  Station:  ---  Consistency: ---  Position: ---  Membrane status: Intact Amniotic color: N/a  Labs: Lab Results  Component Value Date   WBC 8.5 09/21/2023   HGB 12.3 09/21/2023   HCT 35.6 (L) 09/21/2023   MCV 85.6 09/21/2023   PLT 179 09/21/2023    Assessment / Plan: Induction of labor due to term with favorable cervix and postterm 0630 Cook Cath placed 0805 Pitocin  started and GBS prophylaxis started  Labor: Progressing normally Preeclampsia:   127/81 Fetal Wellbeing:  Category I Pain Control:  Labor support without medications I/D:   Afebrile, GBS pos - abx dose x3, Intact Anticipated MOD:  NSVD  Margery FORBES Coe, CNM 09/21/2023, 6:06 PM

## 2023-09-21 NOTE — Progress Notes (Addendum)
**   Previous labs collected during pregnancy showed her blood type was O neg with weak D positive reactivity.  She received rhogam at 28 weeks.  On admission her T&S shows she is now O pos, no longer weak D.    Labor Progress Note  Denise Mcgee is a 38 y.o. G2P1001 at [redacted]w[redacted]d by LMP admitted for induction of labor TOLAC due to Elective at term.  Subjective: Pt reports UCs and general discomfort from [facility]  Objective: BP 130/79 (BP Location: Right Arm)   Pulse 80   Temp 97.6 F (36.4 C) (Oral)   Resp 18   Ht 5' 3 (1.6 m)   Wt 68.5 kg   BMI 26.75 kg/m   Fetal Assessment: FHT:  FHR: 140 bpm, variability: moderate,  accelerations:  Present,  decelerations:  Absent Category/reactivity:  Category I UC:   regular, every 2-3 min SVE:    Dilation: 1.5cm  Effacement: ---  Station:  ---  Consistency: ---  Position: ---  Membrane status: Intact Amniotic color: N/a  Labs: Lab Results  Component Value Date   WBC 8.5 09/21/2023   HGB 12.3 09/21/2023   HCT 35.6 (L) 09/21/2023   MCV 85.6 09/21/2023   PLT 179 09/21/2023    Assessment / Plan: Induction of labor due to term with favorable cervix and postterm 0630 Cook Cath placed 0805 Pitocin  started and GBS prophylaxis started  Labor: Progressing normally Preeclampsia:   130/79 Fetal Wellbeing:  Category I Pain Control:  Labor support without medications I/D:   Afebrile, GBS pos - abx started, Intact Anticipated MOD:  NSVD  Jenifer E Dyani Babel, CNM 09/21/2023, 1:18 PM

## 2023-09-21 NOTE — Progress Notes (Signed)
 OB Labor Note   S: Strip/vitals review     O: BP 119/73 (BP Location: Right Arm)   Pulse 64   Temp 98 F (36.7 C) (Oral)   Resp 16   Ht 5' 3 (1.6 m)   Wt 68.5 kg   BMI 26.75 kg/m  Temp (24hrs), Avg:98 F (36.7 C), Min:98 F (36.7 C), Max:98 F (36.7 C)    FHT: Baseline 135, mod variability, + accels, no decels. Cat 1  TOCO: not tracing well   Cervical Exam: deferred, cook catheter in placed   A/P: Denise Mcgee is a 38 y.o. G2P1001 at [redacted]w[redacted]d with a hx of c/s x 1 who is admitted for IOL at term.   #IOL #TOLAC - Cervical dilation: 1 cm on admission. Cook catheter (80/80) placed at 0630. Pitocin  started at 0800. Pit @ 2, continue to titrate per protocol. - CEFM: Cat 1  - Vitals: afebrile, HR normal, BP normal - Weak Rh +- s/p rhogam 07/05/23 - GBS pos- PCN running  #TOLAC - Hx of c/s x 1 for NRFHT   Electronically signed by:  Beverli LULLA Dinsmore, MD, 09/21/2023, 8:29 AM

## 2023-09-21 NOTE — L&D Delivery Note (Signed)
 Delivery Note  First Stage: Labor onset: 09/21/23 @ 2130 Augmentation : Eldonna catheter, pitocin , AROM Analgesia Holli intrapartum: epidural AROM at 0710  Second Stage: Complete dilation at 0913 Onset of pushing at 0915 FHR second stage Category II, recurrent early decelerations with moderate variability, then changed to recurrent variable decelerations with moderate variability, then recurrent late decelerations with moderate variability, then variability started to become more minimal.   Prolonged crowning for almost one hour d/t tight maternal perineal tissue. Once signs of fetal distress started, performed Ritgen's maneuver then perineal massage to allow fetal head to be born.   Delivery of a viable female infant. 09/22/2023 at 1140 by Edsel Blush, CNM. delivery of fetal head in OA position with restitution to ROA. No nuchal cord;  Anterior then posterior shoulders delivered easily with gentle downward traction. Baby placed on mom's chest, and attended to by peds.  Cord double clamped within 30 seconds of delivery d/t infant status and cut by CNM with extended length for FOB to cut at the warmer. Infant handed to peds.  Cord blood sample collected   Third Stage: Placenta delivered Keren intact with 3 VC @ 1142 Placenta disposition: discarded Uterine tone firm / bleeding scant  2nd degree laceration identified with bilateral extensions into the labia and hymenectomy. Anesthesia for repair: epidural Repair 2-0 Vicryl Dr. Verdon called to assist with repair. I repaired the right labia and vaginal wall. See Dr. Larae note for information about the rest of the repair. Est. Blood Loss (mL):  Complications: prolonged crowning  Mom to postpartum.  Baby to Couplet care / Skin to Skin.  Newborn: Birth Weight: 7lb 9oz  Apgar Scores: 6, 9 Feeding planned: breastfeeding

## 2023-09-21 NOTE — Progress Notes (Signed)
 OB Labor Note   S: Strip/vitals review     O: BP 130/79 (BP Location: Right Arm)   Pulse 80   Temp 97.6 F (36.4 C) (Oral)   Resp 18   Ht 5' 3 (1.6 m)   Wt 68.5 kg   BMI 26.75 kg/m  Temp (24hrs), Avg:97.8 F (36.6 C), Min:97.6 F (36.4 C), Max:98 F (36.7 C)    FHT: Baseline 135, mod variability, + accels, no decels. Cat 1  TOCO: q3min   Cervical Exam: deferred, cook catheter in placed   A/P: Denise Mcgee is a 38 y.o. G2P1001 at [redacted]w[redacted]d with a hx of c/s x 1 who is admitted for IOL at term.   #IOL #TOLAC - Cervical dilation: 1 cm on admission. Cook catheter (80/80) placed at 0630. Pitocin  started at 0800. Pit @ 6, continue to titrate per protocol. - CEFM: Cat 1  - Vitals: afebrile, HR normal, BP normal - Rh status: Rh+ on type and screen here but weak Rh + on prenatal labs- s/p rhogam 07/05/23 - GBS pos- PCN running  #TOLAC - Hx of c/s x 1 for NRFHT   Electronically signed by:  Beverli LULLA Dinsmore, MD, 09/21/2023, 2:38 PM

## 2023-09-21 NOTE — Progress Notes (Signed)
 Labor Progress Note  Denise Mcgee is a 38 y.o. G2P1001 at [redacted]w[redacted]d by LMP admitted for induction of labor TOLAC due to Elective at term.  Subjective: Assumed care, received report from off going CNM and patient's RN.  Objective: BP 119/73 (BP Location: Right Arm)   Pulse 64   Temp 98 F (36.7 C) (Oral)   Resp 16   Ht 5' 3 (1.6 m)   Wt 68.5 kg   BMI 26.75 kg/m   Fetal Assessment: FHT:  FHR: 140 bpm, variability: moderate,  accelerations:  Present,  decelerations:  Absent Category/reactivity:  Category I UC:   none SVE:    Dilation: 1.5cm  Effacement: ---  Station:  ---  Consistency: ---  Position: ---  Membrane status: Intact Amniotic color: N/a  Labs: Lab Results  Component Value Date   WBC 8.5 09/21/2023   HGB 12.3 09/21/2023   HCT 35.6 (L) 09/21/2023   MCV 85.6 09/21/2023   PLT 179 09/21/2023    Assessment / Plan: Induction of labor due to term with favorable cervix and postterm 0805 Pitocin  started and GBS prophylaxis started  Labor: Progressing normally Preeclampsia:   119/73 Fetal Wellbeing:  Category I Pain Control:  Labor support without medications I/D:   Afebrile, GBS pos - abx started, Intact Anticipated MOD:  NSVD  Margery FORBES Coe, CNM 09/21/2023, 8:18 AM

## 2023-09-21 NOTE — H&P (Addendum)
 OB History & Physical   History of Present Illness:   Chief Complaint: TOLAC IOL at term  HPI:  Denise Mcgee is a 38 y.o. G46P1001 female at [redacted]w[redacted]d,LMP3/27/24  consistent with US  at [redacted]w[redacted]d, with Estimated Date of Delivery: 09/21/23.  She presents to L&D for induction of labor due to Elective at term  TOLAC.  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  Past Surgical History:  Procedure Laterality Date   CESAREAN SECTION  08/02/2021   Procedure: CESAREAN SECTION;  Surgeon: Connell Davies, MD;  Location: [facility] ORS;  Service: Obstetrics;;   HYSTEROSALPINGOGRAM  09/25/2020        Allergies  Allergen Reactions   Other Hives    Pt stated had hives when on non tapered steroid (unknown name) with Singulair and Mucinex. May have been combination or steroid.   Pt stated had hives when on non tapered steroid (unknown name) with Singulair and Mucinex. May have been combination or steroid.   No Known Allergies     Prior to Admission medications   Medication Sig Start Date End Date Taking? Authorizing Provider  acetaminophen  (TYLENOL ) 500 MG tablet Take 2 tablets (1,000 mg total) by mouth every 6 (six) hours. 08/05/21   McVey,  Asberry LABOR, CNM  coconut oil OIL Apply 1 application topically as needed. 08/05/21   McVey, Asberry LABOR, CNM  ferrous sulfate  325 (65 FE) MG tablet Take 1 tablet (325 mg total) by mouth 2 (two) times daily with a meal. 08/05/21   McVey, Asberry LABOR, CNM  gabapentin  (NEURONTIN ) 300 MG capsule Take 1 capsule (300 mg total) by mouth 2 (two) times daily for 3 days. 08/05/21 08/08/21  McVey, Asberry LABOR, CNM  ibuprofen  (ADVIL ) 600 MG tablet Take 1 tablet (600 mg total) by mouth every 6 (six) hours. 08/05/21   McVey, Asberry LABOR, CNM  Prenatal Vit-Fe Fumarate-FA (PRENATAL MULTIVITAMIN) TABS tablet Take 1 tablet by mouth daily at 12 noon.    [provider]  senna-docusate (SENOKOT-S) 8.6-50 MG tablet Take 2 tablets by mouth daily. 08/05/21   McVey, Asberry LABOR, CNM  simethicone  (MYLICON) 80 MG chewable tablet Chew 1 tablet (80 mg total) by mouth as needed for flatulence. 08/05/21   McVey, Asberry LABOR, CNM     Prenatal care site:  [facility]  OB History  Gravida Para Term Preterm AB Living  2 1 1  0 0 1  SAB IAB Ectopic Multiple Live Births  0 0 0 0 1    # Outcome Date GA Lbr Len/2nd Weight Sex Type Anes PTL Lv  2 Current           1 Term 08/02/21 [redacted]w[redacted]d  3420 g M CS-LTranv EPI  LIV     Name: Blum,BOY Emaley     Apgar1: 8  Apgar5: 8  Social History: She  reports that she has never smoked. She has never been exposed to tobacco smoke. She has never used smokeless tobacco. She reports that she does not drink alcohol.  Family History: family history is not on file.   Review of Systems: A full review of systems was performed and negative except as noted in the HPI.     Physical Exam:  Vital Signs: Ht 5' 3 (1.6 m)   Wt 68.5 kg   BMI 26.75 kg/m   General: no acute distress.  HEENT: normocephalic, atraumatic Heart: regular rate & rhythm Lungs: normal respiratory effort Abdomen: soft, gravid, non-tender;  Pelvic:   External: Normal external female genitalia  Cervix:   /    /      Extremities: non-tender, symmetric, no edema bilaterally.  DTRs: +2  Neurologic: Alert & oriented x 3.    No results found for this or any previous visit (from the past 24 hours).  Pertinent Results:  Prenatal Labs: Blood type/Rh O NEG  Antibody screen neg  Rubella immune    Varicella Immune  RPR NR  HBsAg   NR  Hep C NR  HIV   NR  GC neg  Chlamydia neg  Genetic screening cfDNA negative  1 hour GTT 129  3 hour GTT    GBS   Positive     FHT:  FHR: 135 bpm, variability: moderate,  accelerations:  Present,  decelerations:  Absent Category/reactivity:  Category I UC:   irregular, every 6 minutes   Cephalic by Leopolds and SVE   No results found.  Assessment:  Denise Mcgee is a 38 y.o. G2P1001 female at [redacted]w[redacted]d with AMA, TOLAC, GBS pos, hx of pp depression, hx of c-section.   Plan:  1. Admit to Labor & Delivery - consents reviewed and obtained - .Dr. CHARM Dinsmore MD notified of admission and plan of care   2. Fetal Well being  - Fetal Tracing: category 1 - Group B Streptococcus ppx  indicated: GBS positive - Presentation: cephalic confirmed by sve   3. Routine OB: - Prenatal labs reviewed, as above - Rh negative - CBC, T&S, RPR on admit - Clear liquid diet , continuous IV fluids  4. Induction of labor  - Contractions monitored with external toco - Pelvis  adequate for trial of labor  - Plan for induction with oxytocin  and cervical balloon  - Augmentation with AROM as appropriate  - Plan for  continuous fetal monitoring - Maternal pain control as desired; planning regional anesthesia and IVPM - Anticipate vaginal delivery TOLAC -Cooks Cervical Cath -Pitocin  -AROM when appropriate  5. Post Partum Planning: - Infant feeding: breast feeding - Contraception: Contraceptives: Undecided - Tdap vaccine: Given prenatally - Flu vaccine: Given prenatally -RSV vaccine:RSV Given: Given during pregnancy >/=14 days ago  Aniella Wandrey,  CNM 09/21/23 6:11 AM  Bobbette Brunswick, CNM Certified Nurse Midwife [facility] [place]  [facility]

## 2023-09-21 NOTE — Anesthesia Procedure Notes (Signed)
 Epidural Patient location during procedure: OB Start time: 09/21/2023 8:13 PM End time: 09/21/2023 8:18 PM  Staffing Anesthesiologist: Dario Barter, MD Performed: anesthesiologist   Preanesthetic Checklist Completed: patient identified, IV checked, site marked, risks and benefits discussed, surgical consent, monitors and equipment checked, pre-op evaluation and timeout performed  Epidural Patient position: sitting Prep: ChloraPrep Patient monitoring: heart rate, continuous pulse ox and blood pressure Approach: midline Location: L3-L4 Injection technique: LOR saline  Needle:  Needle type: Tuohy  Needle gauge: 17 G Needle length: 9 cm Needle insertion depth: 4.5 cm Catheter type: closed end flexible Catheter size: 19 Gauge Catheter at skin depth: 9.5 cm Test dose: negative and 1.5% lidocaine  with Epi 1:200 K  Assessment Sensory level: T10 Events: blood not aspirated, no cerebrospinal fluid, injection not painful, no injection resistance, no paresthesia and negative IV test  Additional Notes 1st attempt Pt. Evaluated and documentation done after procedure finished. Patient identified. Risks/Benefits/Options discussed with patient including but not limited to bleeding, infection, nerve damage, paralysis, failed block, incomplete pain control, headache, blood pressure changes, nausea, vomiting, reactions to medication both or allergic, itching and postpartum back pain. Confirmed with bedside nurse the patient's most recent platelet count. Confirmed with patient that they are not currently taking any anticoagulation, have any bleeding history or any family history of bleeding disorders. Patient expressed understanding and wished to proceed. All questions were answered. Sterile technique was used throughout the entire procedure. Please see nursing notes for vital signs. Test dose was given through epidural catheter and negative prior to continuing to dose epidural or start infusion.  Warning signs of high block given to the patient including shortness of breath, tingling/numbness in hands, complete motor block, or any concerning symptoms with instructions to call for help. Patient was given instructions on fall risk and not to get out of bed. All questions and concerns addressed with instructions to call with any issues or inadequate analgesia.    Patient tolerated the insertion well without immediate complications.Reason for block:procedure for pain

## 2023-09-21 NOTE — Anesthesia Preprocedure Evaluation (Signed)
 Anesthesia Evaluation  Patient identified by MRN, date of birth, ID band Patient awake    Reviewed: Allergy & Precautions, NPO status , Patient's Chart, lab work & pertinent test results  History of Anesthesia Complications Negative for: history of anesthetic complications  Airway Mallampati: II  TM Distance: >3 FB Neck ROM: Full    Dental  (+) Teeth Intact   Pulmonary neg pulmonary ROS          Cardiovascular negative cardio ROS      Neuro/Psych negative neurological ROS  negative psych ROS   GI/Hepatic Neg liver ROS,,,  Endo/Other  negative endocrine ROS    Renal/GU negative Renal ROS     Musculoskeletal negative musculoskeletal ROS (+)    Abdominal   Peds  Hematology negative hematology ROS (+)   Anesthesia Other Findings History reviewed. No pertinent past medical history.   Reproductive/Obstetrics (+) Pregnancy                             Anesthesia Physical Anesthesia Plan  ASA: 2  Anesthesia Plan: Epidural   Post-op Pain Management:    Induction:   PONV Risk Score and Plan:   Airway Management Planned:   Additional Equipment:   Intra-op Plan:   Post-operative Plan:   Informed Consent: I have reviewed the patients History and Physical, chart, labs and discussed the procedure including the risks, benefits and alternatives for the proposed anesthesia with the patient or authorized representative who has indicated his/her understanding and acceptance.       Plan Discussed with:   Anesthesia Plan Comments:         Anesthesia Quick Evaluation

## 2023-09-22 ENCOUNTER — Encounter: Payer: Self-pay | Admitting: Obstetrics and Gynecology

## 2023-09-22 DIAGNOSIS — Z98891 History of uterine scar from previous surgery: Secondary | ICD-10-CM

## 2023-09-22 DIAGNOSIS — O09529 Supervision of elderly multigravida, unspecified trimester: Secondary | ICD-10-CM

## 2023-09-22 DIAGNOSIS — O34219 Maternal care for unspecified type scar from previous cesarean delivery: Principal | ICD-10-CM

## 2023-09-22 DIAGNOSIS — B951 Streptococcus, group B, as the cause of diseases classified elsewhere: Secondary | ICD-10-CM

## 2023-09-22 DIAGNOSIS — O99891 Other specified diseases and conditions complicating pregnancy: Secondary | ICD-10-CM

## 2023-09-22 DIAGNOSIS — Z6791 Unspecified blood type, Rh negative: Secondary | ICD-10-CM

## 2023-09-22 MED ORDER — PRENATAL MULTIVITAMIN CH
1.0000 | ORAL_TABLET | Freq: Every day | ORAL | Status: DC
  Administered 2023-09-23 – 2023-09-24 (×2): 1 via ORAL
  Filled 2023-09-22 (×2): qty 1

## 2023-09-22 MED ORDER — SENNOSIDES-DOCUSATE SODIUM 8.6-50 MG PO TABS
2.0000 | ORAL_TABLET | Freq: Every day | ORAL | Status: DC
  Filled 2023-09-22: qty 2

## 2023-09-22 MED ORDER — ACETAMINOPHEN 325 MG PO TABS
650.0000 mg | ORAL_TABLET | ORAL | Status: DC | PRN
  Administered 2023-09-22: 650 mg via ORAL
  Filled 2023-09-22 (×2): qty 2

## 2023-09-22 MED ORDER — ONDANSETRON HCL 4 MG/2ML IJ SOLN: 4.0000 mg | INTRAMUSCULAR | Status: DC | PRN

## 2023-09-22 MED ORDER — CALCIUM CARBONATE ANTACID 500 MG PO CHEW: 2.0000 | CHEWABLE_TABLET | Freq: Once | ORAL | Status: AC

## 2023-09-22 MED ORDER — DIBUCAINE (PERIANAL) 1 % EX OINT
1.0000 | TOPICAL_OINTMENT | CUTANEOUS | Status: DC | PRN
  Filled 2023-09-22 (×2): qty 28

## 2023-09-22 MED ORDER — COCONUT OIL OIL
1.0000 | TOPICAL_OIL | Status: DC | PRN
  Filled 2023-09-22: qty 7.5
  Filled 2023-09-22: qty 15

## 2023-09-22 MED ORDER — ESTRADIOL 0.1 MG/GM VA CREA: 1.0000 | TOPICAL_CREAM | VAGINAL | 0 refills | Status: AC

## 2023-09-22 MED ORDER — ZOLPIDEM TARTRATE 5 MG PO TABS: 5.0000 mg | ORAL_TABLET | Freq: Every evening | ORAL | Status: DC | PRN

## 2023-09-22 MED ORDER — CALCIUM CARBONATE ANTACID 500 MG PO CHEW
CHEWABLE_TABLET | ORAL | Status: AC
  Administered 2023-09-22: 400 mg via ORAL
  Filled 2023-09-22: qty 2

## 2023-09-22 MED ORDER — TETANUS-DIPHTH-ACELL PERTUSSIS 5-2.5-18.5 LF-MCG/0.5 IM SUSY
0.5000 mL | PREFILLED_SYRINGE | Freq: Once | INTRAMUSCULAR | Status: DC
  Filled 2023-09-22: qty 0.5

## 2023-09-22 MED ORDER — IBUPROFEN 600 MG PO TABS
600.0000 mg | ORAL_TABLET | Freq: Four times a day (QID) | ORAL | Status: DC
  Administered 2023-09-22 – 2023-09-24 (×8): 600 mg via ORAL
  Filled 2023-09-22 (×9): qty 1

## 2023-09-22 MED ORDER — ESTROGENS CONJUGATED 0.625 MG/GM VA CREA
1.0000 | TOPICAL_CREAM | Freq: Every day | VAGINAL | Status: DC
  Administered 2023-09-23 – 2023-09-24 (×2): 1 via VAGINAL
  Filled 2023-09-22: qty 30

## 2023-09-22 MED ORDER — SIMETHICONE 80 MG PO CHEW: 80.0000 mg | CHEWABLE_TABLET | ORAL | Status: DC | PRN

## 2023-09-22 MED ORDER — WITCH HAZEL-GLYCERIN EX PADS
1.0000 | MEDICATED_PAD | CUTANEOUS | Status: DC | PRN
  Filled 2023-09-22 (×2): qty 100

## 2023-09-22 MED ORDER — OXYCODONE HCL 5 MG PO TABS: 5.0000 mg | ORAL_TABLET | ORAL | Status: DC | PRN

## 2023-09-22 MED ORDER — SERTRALINE HCL 20 MG/ML PO CONC
25.0000 mg | Freq: Every day | ORAL | Status: DC
  Filled 2023-09-22 (×3): qty 1.25

## 2023-09-22 MED ORDER — OXYCODONE HCL 5 MG PO TABS: 10.0000 mg | ORAL_TABLET | ORAL | Status: DC | PRN

## 2023-09-22 MED ORDER — DIPHENHYDRAMINE HCL 25 MG PO CAPS: 25.0000 mg | ORAL_CAPSULE | Freq: Four times a day (QID) | ORAL | Status: DC | PRN

## 2023-09-22 MED ORDER — BENZOCAINE-MENTHOL 20-0.5 % EX AERO
1.0000 | INHALATION_SPRAY | CUTANEOUS | Status: DC | PRN
  Filled 2023-09-22: qty 56

## 2023-09-22 MED ORDER — ONDANSETRON HCL 4 MG PO TABS: 4.0000 mg | ORAL_TABLET | ORAL | Status: DC | PRN

## 2023-09-22 NOTE — Progress Notes (Signed)
 Called to patient room to evaluate vaginal and perineal tearing after vaginal delivery. CNM Wilson repaired well the internal tearing. The patient's  inferior hymenal ring was absent, with intact perineal body but missing vaginal mucosa from the entire vestibule. The muscles were firm beneath. Using small interrupted horizontal mattress stiches, followed by a running stitch, this area was brought together with minimal tension. The end result was a vestibulectomy, and the vaginal mucosa was securely attached to the external skin. Several interrupted stiches were placed along the left vulva.  Minimal blood loss for this portion of the procedure.  I will give her vaginal estrogen cream for 6 weeks after delivery, and recommend PFPT after 8 weeks for any concerns.

## 2023-09-22 NOTE — Discharge Summary (Signed)
 Postpartum Discharge Summary  Patient Name: Denise Mcgee DOB: December 23, 1985 MRN: [id number]  Date of admission: 09/21/2023 Delivery date:09/22/2023 Delivering provider: TANDA HOUSTON RENEE Date of discharge: 09/24/2023  Primary OB: [facility] LMP:No LMP recorded. EDC Estimated Date of Delivery: 09/21/23 Gestational Age at Delivery: [redacted]w[redacted]d   Admitting diagnosis: Encounter for planned induction of labor [Z34.90] Intrauterine pregnancy: [redacted]w[redacted]d     Secondary diagnosis:   Principal Problem:   VBAC (vaginal birth after Cesarean) Active Problems:   Encounter for planned induction of labor   Previous cesarean section   History of postpartum depression, currently pregnant in third trimester   Rh negative state in antepartum period   Positive GBS test   Advanced maternal age in multigravida   Discharge Diagnosis: VBAC                                                Post partum procedures: None Induction:: AROM, Pitocin , and OP Foley Complications: None Delivery Type: vaginal birth after cesarean (VBAC) Anesthesia: epidural anesthesia Placenta: spontaneous To Pathology: No  Laceration: 2nd degree and with bilateral labial extensions and hymenectomy Episiotomy: none  Prenatal Labs:  Blood type/Rh O NEG (confirmed to be O positive via hospital lab)  Antibody screen neg  Rubella immune    Varicella Immune  RPR NR  HBsAg   NR  Hep C NR  HIV   NR  GC neg  Chlamydia neg  Genetic screening cfDNA negative  1 hour GTT 129  3 hour GTT    GBS   Positive    Hospital course: Induction of Labor With Vaginal Delivery   38 y.o. yo G2P2002 at 102w1d was admitted to the hospital 09/21/2023 for induction of labor.  Indication for induction: Elective.  Patient had an labor course complicated by prolonged crowning in second stage. Ritgens and perineal massage were performed to assist with delivery of fetal head.  Membrane Rupture Time/Date: 7:10 AM,09/22/2023  Delivery Method:VBAC,  Spontaneous Operative Delivery:N/A Episiotomy: None Lacerations:  2nd degree;Labial Details of delivery can be found in separate delivery note.  Patient had a postpartum course complicated by none. Patient is discharged home 09/24/23.  Newborn Data: Slater Norris Birth date:09/22/2023 Birth time:11:40 AM Gender:Female Living status:Living Apgars:6 ,9  Weight:3440 g  Magnesium  Sulfate received: No BMZ received: No Rhophylac :No MMR:No Varivax vaccine given: was not indicated T-DaP:Given prenatally Flu: No  Transfusion:No  Physical exam  Vitals:   09/23/23 1651 09/23/23 1933 09/23/23 2325 09/24/23 0831  BP: 105/71 (!) 139/94 115/73 (!) 89/60  Pulse: (!) 116 90 87 77  Resp: 18 18 18 18   Temp: 97.8 F (36.6 C) 98 F (36.7 C) 97.8 F (36.6 C) 97.6 F (36.4 C)  TempSrc: Oral Oral Oral Oral  SpO2: 100% 100% 100% 99%  Weight:      Height:       General: alert, cooperative, and no distress Lochia: appropriate Uterine Fundus: firm Perineum: minimal edema/repair well approximated DVT Evaluation: No evidence of DVT seen on physical exam.  Labs: Lab Results  Component Value Date   WBC 13.0 (H) 09/23/2023   HGB 9.6 (L) 09/23/2023   HCT 28.4 (L) 09/23/2023   MCV 88.2 09/23/2023   PLT 140 (L) 09/23/2023      Latest Ref Rng & Units 08/03/2021    3:02 AM  CMP  Creatinine 0.44 - 1.00 mg/dL  0.62    Edinburgh Score:    09/22/2023   11:45 PM  Edinburgh Postnatal Depression Scale Screening Tool  I have been able to laugh and see the funny side of things. 0  I have looked forward with enjoyment to things. 0  I have blamed myself unnecessarily when things went wrong. 2  I have been anxious or worried for no good reason. 1  I have felt scared or panicky for no good reason. 0  Things have been getting on top of me. 1  I have been so unhappy that I have had difficulty sleeping. 0  I have felt sad or miserable. 0  I have been so unhappy that I have been crying. 0  The  thought of harming myself has occurred to me. 0  Edinburgh Postnatal Depression Scale Total 4    Risk assessment for postpartum VTE and prophylactic treatment: Very high risk factors: None High risk factors: None Moderate risk factors: None  Postpartum VTE prophylaxis with LMWH not indicated  After visit meds:  Allergies as of 09/24/2023       Reactions   Other Hives   Pt stated had hives when on non tapered steroid (unknown name) with Singulair and Mucinex. May have been combination or steroid.  Pt stated had hives when on non tapered steroid (unknown name) with Singulair and Mucinex. May have been combination or steroid.   No Known Allergies         Medication List     STOP taking these medications    senna-docusate 8.6-50 MG tablet Commonly known as: Senokot-S   simethicone  80 MG chewable tablet Commonly known as: MYLICON       TAKE these medications    acetaminophen  500 MG tablet Commonly known as: TYLENOL  Take 2 tablets (1,000 mg total) by mouth every 6 (six) hours.   coconut oil Oil Apply 1 application topically as needed.   estradiol  0.1 MG/GM vaginal cream Commonly known as: ESTRACE  VAGINAL Place 1 Applicatorful vaginally 3 (three) times a week.   ferrous sulfate  325 (65 FE) MG tablet Take 1 tablet (325 mg total) by mouth 2 (two) times daily with a meal.   gabapentin  300 MG capsule Commonly known as: NEURONTIN  Take 1 capsule (300 mg total) by mouth 2 (two) times daily for 3 days.   ibuprofen  600 MG tablet Commonly known as: ADVIL  Take 1 tablet (600 mg total) by mouth every 6 (six) hours as needed for mild pain (pain score 1-3) or cramping. What changed:  when to take this reasons to take this   prenatal multivitamin Tabs tablet Take 1 tablet by mouth daily at 12 noon.       Discharge home in stable condition Infant Feeding: Bottle and Breast Infant Disposition:home with mother Discharge instruction: per After Visit Summary and Postpartum  booklet. Activity: Advance as tolerated. Pelvic rest for 6 weeks.  Diet: routine diet Anticipated Birth Control: Unsure Postpartum Appointment:2 weeks Additional Postpartum F/U: Postpartum Depression checkup and tear check Future Appointments:No future appointments. Follow up Visit:  Follow-up Information     Tanda Edsel Fuller, CNM Follow up in 2 week(s).   Specialty: Certified Nurse Midwife Why: 2wk mood check and tear check Contact information: [address] [place] [address] [phone]         Tanda Edsel Fuller, CNM Follow up in 6 week(s).   Specialty: Certified Nurse Midwife Why: 6wks postpartum Contact information: [address] [place] [address] [phone]  Plan:  Denise Mcgee was discharged to home in good condition. Follow-up appointment as directed.    Signed:  Therisa CHRISTELLA Pillow, CNM 09/24/2023 10:48 AM   Therisa Pillow, CNM Certified Nurse Midwife [place]  [facility]

## 2023-09-22 NOTE — Progress Notes (Signed)
  Labor Progress Note  Denise Mcgee is a 38 y.o. G2P1001 at [redacted]w[redacted]d by LMP admitted for induction of labor TOLAC due to Elective at term.  Subjective: Pt is comfortable with epidural  Objective: BP (!) 96/54   Pulse 94   Temp 97.8 F (36.6 C) (Oral)   Resp 16   Ht 5' 3 (1.6 m)   Wt 68.5 kg   SpO2 95%   BMI 26.75 kg/m   Fetal Assessment: FHT:  FHR: 145 bpm, variability: moderate,  accelerations:  Present,  decelerations:  Present earlies Category/reactivity:  Category I UC:   regular, every 2-3 min SVE:    Dilation: 7.5cm  Effacement: 80-90%  Station:  -1  Consistency: soft  Position: middle  Membrane status: possible SROM at 2245 Amniotic color: N/a  Labs: Lab Results  Component Value Date   WBC 8.5 09/21/2023   HGB 12.3 09/21/2023   HCT 35.6 (L) 09/21/2023   MCV 85.6 09/21/2023   PLT 179 09/21/2023    Assessment / Plan: Induction of labor due to term with favorable cervix and postterm 0630 Cook Cath placed 0805 Pitocin  started and GBS prophylaxis started 1845 Cooks cath deflated and removed  5/60-70/-2 2018 Epidural placed 2130 5-6/80/-1  2345 7.5/80/-1 Pitocin  currently at 14mU  Labor: Progressing normally Preeclampsia:   98/54 Fetal Wellbeing:  Category I Pain Control:  Epidural I/D:   Afebrile, GBS pos - abx dose x4, Possible SROM Anticipated MOD:  NSVD  Margery FORBES Coe, CNM 09/22/2023, 12:15 AM

## 2023-09-22 NOTE — Progress Notes (Signed)
 Labor Progress Note  Denise Mcgee is a 38 y.o. G2P1001 at [redacted]w[redacted]d by LMP admitted for induction of labor TOLAC due to Elective at term.  Subjective: she is feeling rectal pressure with contractions and an urge to push  Objective: BP 117/67 (BP Location: Right Arm)   Pulse 69   Temp 97.6 F (36.4 C) (Oral)   Resp 16   Ht 5' 3 (1.6 m)   Wt 68.5 kg   SpO2 99%   BMI 26.75 kg/m  Notable VS details: reviewed  Fetal Assessment: FHT:  FHR: 130 bpm, variability: moderate,  accelerations:  Present,  decelerations:  Present recurrent early and late decelerations Category/reactivity:  Category II UC:   regular, every 2-3 minutes SVE:    Dilation: 10cm  Effacement: 100%  Station:  +2  Consistency: soft  Position: anterior  Membrane status: AROM @ 0710 Amniotic color: clear  Labs: Lab Results  Component Value Date   WBC 8.5 09/21/2023   HGB 12.3 09/21/2023   HCT 35.6 (L) 09/21/2023   MCV 85.6 09/21/2023   PLT 179 09/21/2023   Assessment / Plan: 38 year old G2P1001 at [redacted]w[redacted]d here with TOLAC IOL for elective at term, now second stage and beginning to push  Labor:  Feeling urge to push, pushing well with good maternal effort and fetal descent. Dr. Verdon updated on labor progress. Preeclampsia:   BP 117/67 Fetal Wellbeing:  Category II, overall reassuring, moderate variability Pain Control:  Epidural I/D:   GBS positive, treated with PCN x 6 doses, AROM @ 0710 Anticipated MOD:  NSVD  Edsel Charlies Blush, CNM 09/22/2023, 9:23 AM

## 2023-09-22 NOTE — Lactation Note (Signed)
 This note was copied from a baby's chart. Lactation Consultation Note  Patient Name: Denise Mcgee Unijb'd Date: 09/22/2023 Age:37 hours Reason for consult: Initial assessment;Term   Maternal Data Has patient been taught Hand Expression?: Yes Does the patient have breastfeeding experience prior to this delivery?: Yes How long did the patient breastfeed?: 9yr  Met w/ patient for first time at 5hrs of age for baby Denise Slater.  This is patients 2nd baby.  This was a VBAC delivery, w/ a long pushing stage.  Feeding goal is to breastfeed.  Patient stated that she has a Spectra  breast pump at home.   Feeding Mother's Current Feeding Choice: Breast Milk  Infant went to the rt breast in cradle hold.  Several attempts were made at first before infant sustained latch.  Patient felt strong tugs w/o pain.  LC provided some tips and tricks to help sustain and maintain a deep latch.   LATCH Score Latch: Repeated attempts needed to sustain latch, nipple held in mouth throughout feeding, stimulation needed to elicit sucking reflex.  Audible Swallowing: A few with stimulation  Type of Nipple: Everted at rest and after stimulation (Short nipples)  Comfort (Breast/Nipple): Soft / non-tender  Hold (Positioning): Assistance needed to correctly position infant at breast and maintain latch.  LATCH Score: 7  Interventions Interventions: Breast feeding basics reviewed;Assisted with latch;Hand express;Breast compression;Adjust position;Support pillows;Position options;Education  LC provided education on the following;  milk production expectations, hunger cues, day 1/2 wet/dirty diapers, hand expression, cluster feeding, benefits of STS and arousing infant for a feeding.  Lactation informed patient of feeding infant at least 8 or more times w/in a 24hr period but not exceeding 3hrs. Patient verbalized understanding.      Discharge Pump: Personal;DEBP;Hands [name] (Spectra ) WIC Program:  No  Consult Status Consult Status: Follow-up Date: 09/23/23 Follow-up type: In-patient    [name] S [name] 09/22/2023, 5:10 PM

## 2023-09-22 NOTE — Progress Notes (Signed)
  Labor Progress Note  Denise Mcgee is a 38 y.o. G2P1001 at [redacted]w[redacted]d by LMP admitted for induction of labor TOLAC due to Elective at term.  Subjective: Pt is comfortable with epidural  Objective: BP (!) 96/54   Pulse 94   Temp 98.8 F (37.1 C) (Oral)   Resp 18   Ht 5' 3 (1.6 m)   Wt 68.5 kg   SpO2 98%   BMI 26.75 kg/m   Fetal Assessment: FHT:  FHR: 150 bpm, variability: moderate,  accelerations:  Present,  decelerations:  Present earlies Category/reactivity:  Category I UC:   regular, every 1-4 min SVE:    Dilation: 8.5cm  Effacement: 80%  Station:  -1  Consistency: soft  Position: middle  Membrane status: possible SROM at 2245 Amniotic color: N/a  Labs: Lab Results  Component Value Date   WBC 8.5 09/21/2023   HGB 12.3 09/21/2023   HCT 35.6 (L) 09/21/2023   MCV 85.6 09/21/2023   PLT 179 09/21/2023    Assessment / Plan: Induction of labor due to term with favorable cervix and postterm 0630 Cook Cath placed 0805 Pitocin  started and GBS prophylaxis started 1845 Cooks cath deflated and removed  5/60-70/-2 2018 Epidural placed 2130 5-6/80/-1  2345 7.5/80/-1  0320 8.5/80/-1  Pitocin  currently at 10mU  Labor: Progressing normally Preeclampsia:   96/54 Fetal Wellbeing:  Category I Pain Control:  Epidural I/D:   Afebrile, GBS pos - abx dose x5, Possible SROM Anticipated MOD:  NSVD  Margery FORBES Coe, CNM 09/22/2023, 4:44 AM

## 2023-09-23 LAB — CBC
HCT: 28.4 % — ABNORMAL LOW (ref 36.0–46.0)
Hemoglobin: 9.6 g/dL — ABNORMAL LOW (ref 12.0–15.0)
MCH: 29.8 pg (ref 26.0–34.0)
MCHC: 33.8 g/dL (ref 30.0–36.0)
MCV: 88.2 fL (ref 80.0–100.0)
Platelets: 140 10*3/uL — ABNORMAL LOW (ref 150–400)
RBC: 3.22 MIL/uL — ABNORMAL LOW (ref 3.87–5.11)
RDW: 13.5 % (ref 11.5–15.5)
WBC: 13 10*3/uL — ABNORMAL HIGH (ref 4.0–10.5)
nRBC: 0 % (ref 0.0–0.2)

## 2023-09-23 MED ORDER — FERROUS SULFATE 325 (65 FE) MG PO TABS
325.0000 mg | ORAL_TABLET | ORAL | Status: AC
Start: 2023-09-23 — End: 2023-09-23
  Administered 2023-09-23: 325 mg via ORAL
  Filled 2023-09-23: qty 1

## 2023-09-23 MED ORDER — LOPERAMIDE HCL 2 MG PO CAPS: 2.0000 mg | ORAL_CAPSULE | ORAL | Status: AC | PRN

## 2023-09-23 MED ORDER — LOPERAMIDE HCL 2 MG PO CAPS
4.0000 mg | ORAL_CAPSULE | Freq: Once | ORAL | Status: AC
  Administered 2023-09-23: 4 mg via ORAL
  Filled 2023-09-23 (×2): qty 2

## 2023-09-23 NOTE — Lactation Note (Signed)
 This note was copied from a baby's chart. Lactation Consultation Note  Patient Name: Denise Mcgee Unijb'd Date: 09/23/2023 Age:37 hours Reason for consult: Follow-up assessment;Term   Maternal Data Follow up assessment w/ a P2 patient and a 21hr old baby Denise.  Patient stated that her nipples were sore and damaged.  Patient stated that infant is very chompy at the breast.   Feeding Mother's Current Feeding Choice: Breast Milk  LC assisted patient w/ football hold for a feeding on the left breast.  Both nipples are red and tender.  Initial latch to the breast was shallow.  LC provided tips and tricks to get infant to latch deeper.  A 20mm nipple shield was introduced to patient and infant.  Patient was familiar with how to use a nipple shield.  Infant was able to open mouth wider and get on the breast deeper with the nipple shield.   LATCH Score Latch: Grasps breast easily, tongue down, lips flanged, rhythmical sucking.  Audible Swallowing: A few with stimulation  Type of Nipple: Everted at rest and after stimulation  Comfort (Breast/Nipple): Filling, red/small blisters or bruises, mild/mod discomfort  Hold (Positioning): No assistance needed to correctly position infant at breast.  LATCH Score: 8   Lactation Tools Discussed/Used Tools: Shells;Nipple Shields Nipple shield size: 20  Sore nipple shells were provided to patient for the damage to nipples.    Interventions Interventions: Breast feeding basics reviewed;Assisted with latch;Skin to skin;Breast massage;Hand express;Breast compression;Adjust position;Support pillows;Shells;Education;CDC Guidelines for Breast Pump Cleaning  LC discussed different forms of nipple care with patient; EBM, lanolin and coconut oil.   Discharge Discharge Education: Engorgement and breast care;Outpatient recommendation  Education on engorgement prevention/treatment was discussed as well as breastmilk storage guidelines.  LC provided  patient with a handout on breastmilk storage guidelines from [facility]. [facility] outpatient lactation services phone number written on the white board in the room.  Patient verbalized understanding.  Consult Status Consult Status: Complete Follow-up type: Call as needed    [name] [name] 09/23/2023, 11:02 AM

## 2023-09-23 NOTE — Anesthesia Postprocedure Evaluation (Signed)
 Anesthesia Post Note  Patient: Denise Mcgee  Procedure(s) Performed: AN AD HOC LABOR EPIDURAL  Patient location during evaluation: Mother Baby Anesthesia Type: Epidural Level of consciousness: awake and alert Pain management: pain level controlled Vital Signs Assessment: post-procedure vital signs reviewed and stable Respiratory status: spontaneous breathing, nonlabored ventilation and respiratory function stable Cardiovascular status: stable Postop Assessment: no headache, no backache and epidural receding Anesthetic complications: no  No notable events documented.   Last Vitals:  Vitals:   09/22/23 2330 09/23/23 0306  BP: 116/75 105/70  Pulse: 77 75  Resp: 20 18  Temp: 36.8 C 36.6 C  SpO2: 100% 99%    Last Pain:  Vitals:   09/23/23 0306  TempSrc: Oral  PainSc:                  Denise Mcgee HERO

## 2023-09-23 NOTE — Progress Notes (Signed)
 Postpartum Day  1  Subjective: 38 y.o. [id number] postpartum day #1 status post normal spontaneous vaginal delivery after a cesarean delivery. She is ambulating, is tolerating po, is voiding spontaneously.  Her pain is well controlled on PO pain medications. Her lochia is less than menses.  Objective: BP 106/82 (BP Location: Left Arm)   Pulse 80   Temp 97.8 F (36.6 C) (Oral)   Resp 18   Ht 5' 3 (1.6 m)   Wt 68.5 kg   SpO2 98%   Breastfeeding Unknown   BMI 26.75 kg/m    Physical Exam:  General: alert, cooperative, appears stated age, and no distress Breasts: soft/nontender Pulm: nl effort Abdomen: soft, non-tender, active bowel sounds Uterine Fundus: firm Perineum: minimal edema, repair well approximated Lochia: appropriate DVT Evaluation: No evidence of DVT seen on physical exam.  Recent Labs    09/21/23 0611 09/23/23 0423  HGB 12.3 9.6*  HCT 35.6* 28.4*  WBC 8.5 13.0*  PLT 179 140*    Assessment/Plan: 37 y.o. G2P2002 postpartum day # 1  1. Continue routine postpartum care  2. Infant feeding status: breast feeding -Lactation consult PRN for breastfeeding   3. Contraception plan:  undecided   4. Acute blood loss anemia - clinically significant.  -Hemodynamically stable and asymptomatic -Intervention: start on oral supplementation with ferrous sulfate  325 mg  - Hgb: 9.6 (09/23/2023)  5. Immunization status:   all immunizations up to date  Disposition: discharge pending newborn status    LOS: 2 days   Deckard Stuber, CNM 09/23/2023, 11:10 AM   ----- Culley Hedeen Certified Nurse Midwife [facility]

## 2023-09-24 MED ORDER — IBUPROFEN 600 MG PO TABS: 600.0000 mg | ORAL_TABLET | Freq: Four times a day (QID) | ORAL | 0 refills | Status: AC | PRN

## 2023-09-24 NOTE — Progress Notes (Signed)
 Infant does not like to be laid on her back. She has been uncomfortable in this position throughout the night. Parents are concerned about this. Will pass along to pediatrician to assess.

## 2023-09-24 NOTE — Discharge Instructions (Signed)
 Discharge Instructions:   If there are any new medications, they have been ordered and will be available for pickup at the listed pharmacy on your way home from the hospital.   Call office if you have any of the following: headache, visual changes, fever >101.0 F, chills, shortness of breath, breast concerns, excessive vaginal bleeding, incision drainage or problems, leg pain or redness, depression or any other concerns. If you have vaginal discharge with an odor, let your doctor know.   It is normal to bleed for up to 6 weeks. You should not soak through more than 1 pad in 1 hour. If you have a blood clot larger than your fist with continued bleeding, call your doctor.   Activity: Do not lift > 10 lbs for 6 weeks (do not lift anything heavier than your baby). No intercourse, tampons, swimming pools, hot tubs, baths (only showers) for 6 weeks.  No driving for 1-2 weeks. Continue prenatal vitamin, especially if breastfeeding. Increase calories and fluids (water) while breastfeeding.   Your milk will come in, in the next couple of days (right now it is colostrum). You may have a slight fever when your milk comes in, but it should go away on its own.  If it does not, and rises above 101 F please call the doctor. You will also feel achy and your breasts will be firm. They will also start to leak. If you are breastfeeding, continue as you have been and you can pump/express milk for comfort.   If you have too much milk, your breasts can become engorged, which could lead to mastitis. This is an infection of the milk ducts. It can be very painful and you will need to notify your doctor to obtain a prescription for antibiotics. You can also treat it with a shower or hot/cold compress.   For concerns about your baby, please call your pediatrician.  For breastfeeding concerns, the lactation consultant can be reached at [phone].   Postpartum blues (feelings of happy one minute and sad another minute)  are normal for the first few weeks but if it gets worse let your doctor know.   Congratulations! We enjoyed caring for you and your new bundle of joy!

## 2023-09-24 NOTE — Progress Notes (Signed)
 Patient discharged home. Infant remains a patient. Mom will room in with her. Discharge instructions and prescriptions given and reviewed with patient. Patient verbalized understanding.  Explained importance of calling scheduling follow-up appointments at [facility]!

## 2023-09-25 ENCOUNTER — Ambulatory Visit: Payer: Self-pay

## 2023-09-25 NOTE — Lactation Note (Signed)
 This note was copied from a baby's chart. Lactation Consultation Note  Patient Name: Girl Karrina Lye Unijb'd Date: 09/25/2023 Age:37 hours Reason for consult: Follow-up assessment;Primapara;Term;Maternal discharge   Maternal Data Follow up assessment w/ a 15hr old baby girl.  Patient stated that they have been providing infant donor milk and feel like she likes the bottle better than mom.  MOB stated that her nipples are scabbed and very sensitive right now.  She has recently been using just the hand pump because she didn't like the DEBP.  Mom stated the DEBP was painful.    Both parents had questions about feeding with a bottle and formula storage.   Feeding Mother's Current Feeding Choice: Breast Milk and Donor Milk Nipple Type: Slow - flow  No feeding observed during this visit.   Interventions Interventions: Breast feeding basics reviewed;Education  LC reviewed nipple care options again with mom.  Encouraged mom to pump when infant receives a bottle of donor milk or formula for the stimulation of her breast.  Pace bottle feeding education was provided to both parents.  LC also discussed nipple flows and types of bottle moving forward. Parents verbalized understanding.   Discharge Discharge Education: Outpatient recommendation;Engorgement and breast care  Reminded parents about outpatient services once they go home.   Consult Status Consult Status: Complete Follow-up type: Call as needed    Aramis Weil S Deneane Stifter 09/25/2023, 9:52 AM

## 2023-12-08 ENCOUNTER — Telehealth (HOSPITAL_COMMUNITY): Payer: Self-pay | Admitting: Lactation Services

## 2023-12-08 NOTE — Telephone Encounter (Signed)
 Mother with toothache requested information regaring Oralgel and breastfeeding.  Message left.
